# Patient Record
Sex: Female | Born: 1975 | Race: White | Hispanic: No | Marital: Married | Smoking: Never smoker
Health system: Southern US, Community
[De-identification: ages and names within clinical notes are randomized; demographics above are authoritative.]

## PROBLEM LIST (undated history)

## (undated) DIAGNOSIS — F419 Anxiety disorder, unspecified: Secondary | ICD-10-CM

## (undated) DIAGNOSIS — G4730 Sleep apnea, unspecified: Secondary | ICD-10-CM

## (undated) DIAGNOSIS — Z9889 Other specified postprocedural states: Secondary | ICD-10-CM

## (undated) DIAGNOSIS — K5792 Diverticulitis of intestine, part unspecified, without perforation or abscess without bleeding: Secondary | ICD-10-CM

## (undated) DIAGNOSIS — J45909 Unspecified asthma, uncomplicated: Secondary | ICD-10-CM

## (undated) DIAGNOSIS — R112 Nausea with vomiting, unspecified: Secondary | ICD-10-CM

## (undated) DIAGNOSIS — F32A Depression, unspecified: Secondary | ICD-10-CM

## (undated) DIAGNOSIS — G43909 Migraine, unspecified, not intractable, without status migrainosus: Secondary | ICD-10-CM

## (undated) HISTORY — DX: Sleep apnea, unspecified: G47.30

## (undated) HISTORY — DX: Migraine, unspecified, not intractable, without status migrainosus: G43.909

## (undated) HISTORY — PX: NASAL SINUS SURGERY: SHX719

## (undated) HISTORY — DX: Unspecified asthma, uncomplicated: J45.909

## (undated) HISTORY — PX: ANKLE SURGERY: SHX546

## (undated) HISTORY — DX: Diverticulitis of intestine, part unspecified, without perforation or abscess without bleeding: K57.92

## (undated) HISTORY — DX: Anxiety disorder, unspecified: F41.9

## (undated) HISTORY — DX: Depression, unspecified: F32.A

## (undated) HISTORY — PX: KNEE SURGERY: SHX244

## (undated) HISTORY — PX: OTHER SURGICAL HISTORY: SHX169

---

## 2018-11-18 HISTORY — PX: COLONOSCOPY: SHX174

## 2020-06-17 ENCOUNTER — Encounter: Payer: Self-pay | Admitting: Internal Medicine

## 2020-07-25 ENCOUNTER — Ambulatory Visit: Payer: Self-pay | Admitting: Gastroenterology

## 2020-07-25 ENCOUNTER — Other Ambulatory Visit: Payer: Self-pay

## 2020-07-25 ENCOUNTER — Encounter: Payer: Self-pay | Admitting: Gastroenterology

## 2020-07-25 ENCOUNTER — Ambulatory Visit (INDEPENDENT_AMBULATORY_CARE_PROVIDER_SITE_OTHER): Payer: PRIVATE HEALTH INSURANCE | Admitting: Gastroenterology

## 2020-07-25 DIAGNOSIS — K5732 Diverticulitis of large intestine without perforation or abscess without bleeding: Secondary | ICD-10-CM

## 2020-07-25 DIAGNOSIS — R198 Other specified symptoms and signs involving the digestive system and abdomen: Secondary | ICD-10-CM | POA: Diagnosis not present

## 2020-07-25 DIAGNOSIS — R110 Nausea: Secondary | ICD-10-CM

## 2020-07-25 DIAGNOSIS — R109 Unspecified abdominal pain: Secondary | ICD-10-CM | POA: Insufficient documentation

## 2020-07-25 NOTE — Progress Notes (Signed)
Primary Care Physician:  Marylee Floras, FNP  Primary Gastroenterologist:  Garfield Cornea, MD    Chief Complaint  Patient presents with  . diverticulits    Still having llq abd pain since 05/2020 episode-went to ED (was treated with Cipro and Flagyl). Has had several flares since last fall. 1st episode 09/2018  . Nausea  . Constipation    With occ days of several stools    HPI:  Tiffany Snow is a 45 y.o. female here at the request of Eulis Manly, FNP for further evaluation of diverticulitis.  Patient presents today reporting several episodes of diverticulitis, last episode from February but does not seem to be getting better.  Initial diagnosis in June 2020 when she presented to [facility] ER with left-sided abdominal pain.  Patient reports having a CT scan for documented diverticulitis.  Was treated with Cipro and Flagyl.  Saw Dr. Earley Brooke in follow-up.  Ports completing a colonoscopy, had at least 2 polyps, advised to come back in 5 years.  Patient states she did okay until October 2021, with recurrent left-sided pain.  Was seen at [facility] ED.  Reports another CT scan showing diverticulitis.  Prescribed Cipro and Flagyl, took only Flagyl because Cipro was on backorder.  Back in the ED the following month (November 2021) at [facility] ED.  Reports another CT scan again showing diverticulitis.  Took course of Cipro and Flagyl which seemed to help.  Had a few little flares after that but in February 2022 ended back up in the ED at [facility] with recurrent left-sided abdominal pain felt to be diverticulitis.  No CT scan done at that time.  She took Cipro and Flagyl and within 2 weeks after stopping medication, her pain returned.  She had a second course given by PCP but pain has not improved.    Continues to have daily left-sided abdominal pain associated with nausea.  Persisting now for 8 weeks.  Nausea at least 3-4 times per week.  No vomiting.  Complains of change in  bowel habit.  Used to have daily morning regular BM for the past 2 months stools feel incomplete/inadequate.  Some days BMs okay, some days no stools.  Other days Bristol 1 or Bristol 5.  No melena or rectal bleeding.  Abdominal pain worse with bending over, twisting.  If she tries to eat anything other than bland soft foods, can make her pain worse.  Denies dysuria or hematuria.  No unintentional weight loss.    Current Outpatient Medications  Medication Sig Dispense Refill  . busPIRone (BUSPAR) 15 MG tablet Take 15 mg by mouth daily.    . cetirizine (ZYRTEC) 10 MG tablet Take 10 mg by mouth daily.    . citalopram (CELEXA) 40 MG tablet Take 40 mg by mouth daily.    . fluticasone (FLONASE) 50 MCG/ACT nasal spray Place into both nostrils as needed for allergies or rhinitis.    Marland Kitchen montelukast (SINGULAIR) 10 MG tablet Take 10 mg by mouth at bedtime.    . Multiple Vitamin (MULTIVITAMIN) tablet Take 1 tablet by mouth daily.    Marland Kitchen topiramate (TOPAMAX) 25 MG tablet Take 25 mg by mouth 2 (two) times daily.     No current facility-administered medications for this visit.    Allergies as of 07/25/2020 - Review Complete 07/25/2020  Allergen Reaction Noted  . Avelox [moxifloxacin] Hives 07/25/2020  . Other  07/25/2020  . Penicillins Hives 07/25/2020    Past Medical History:  Diagnosis  Date  . Anxiety   . Asthma   . Depression   . Migraine   . Sleep apnea     Past Surgical History:  Procedure Laterality Date  . ANKLE SURGERY Left   . carpel tunnel Bilateral   . KNEE SURGERY     four total  . NASAL SINUS SURGERY      Family History  Problem Relation Age of Onset  . Hypertension Mother   . Diverticulitis Mother   . COPD Father   . Colon cancer Neg Hx     Social History   Socioeconomic History  . Marital status: Married    Spouse name: Not on file  . Number of children: Not on file  . Years of education: Not on file  . Highest education level: Not on file  Occupational History   . Not on file  Tobacco Use  . Smoking status: Never Smoker  . Smokeless tobacco: Never Used  Substance and Sexual Activity  . Alcohol use: Not Currently    Comment: no h/o significant etoh use. none in sever years.  . Drug use: Not Currently  . Sexual activity: Not on file  Other Topics Concern  . Not on file  Social History Narrative  . Not on file   Social Determinants of Health   Financial Resource Strain: Not on file  Food Insecurity: Not on file  Transportation Needs: Not on file  Physical Activity: Not on file  Stress: Not on file  Social Connections: Not on file  Intimate Partner Violence: Not on file      ROS:  General: Negative for anorexia, weight loss, fever, chills, fatigue, weakness. Eyes: Negative for vision changes.  ENT: Negative for hoarseness, difficulty swallowing , nasal congestion. CV: Negative for chest pain, angina, palpitations, dyspnea on exertion, peripheral edema.  Respiratory: Negative for dyspnea at rest, dyspnea on exertion, cough, sputum, wheezing.  GI: See history of present illness. GU:  Negative for dysuria, hematuria, urinary incontinence, urinary frequency, nocturnal urination.  MS: Negative for joint pain, low back pain.  Derm: Negative for rash or itching.  Neuro: Negative for weakness, abnormal sensation, seizure, frequent headaches, memory loss, confusion.  Psych: Negative for anxiety, depression, suicidal ideation, hallucinations.  Endo: Negative for unusual weight change.  Heme: Negative for bruising or bleeding. Allergy: Negative for rash or hives.    Physical Examination:  BP 133/75   Pulse 78   Temp (!) 97.5 F (36.4 C) (Temporal)   Ht 4\' 11"  (1.499 m)   Wt 260 lb 12.8 oz (118.3 kg)   BMI 52.68 kg/m    General: Well-nourished, well-developed in no acute distress.  Head: Normocephalic, atraumatic.   Eyes: Conjunctiva pink, no icterus. Mouth: masked Neck: Supple without thyromegaly, masses, or lymphadenopathy.   Lungs: Clear to auscultation bilaterally.  Heart: Regular rate and rhythm, no murmurs rubs or gallops.  Abdomen: Bowel sounds are normal, nondistended, no hepatosplenomegaly or masses, no abdominal bruits or hernia, no rebound or guarding.  Exam limited by body habitus.  Tenderness noted throughout the entire left abdomen from left lower quadrant to left upper quadrant. Rectal: not performed Extremities: No lower extremity edema. No clubbing or deformities.  Neuro: Alert and oriented x 4 , grossly normal neurologically.  Skin: Warm and dry, no rash or jaundice.   Psych: Alert and cooperative, normal mood and affect.   Imaging Studies: No results found.  Impression/plan:  Pleasant 45 year old female with morbid obesity, migraines, sleep apnea, anxiety/depression presenting for further  evaluation of persistent left-sided abdominal pain.  Patient describes several ED evaluations since June 2020, multiple CTs, indicating diverticulitis.  All were done at outside facilities, records not available at this time but have been requested.  Most recently seen in the ED in February, treated empirically for diverticulitis at that time.  Took 2 rounds of Cipro and Flagyl and notes no improvement in symptoms.  She has had some change in bowel habits.  Less productive stools.  Stool consistency ranging from hard to loose.  Initially will obtain records of prior CTs, colonoscopy/path for review.  May require additional CT imaging to rule out complicated diverticulitis given persistent symptoms.  Further recommendations to follow review of studies.  Add fiber supplement 3 to 4 g daily to try and regulate stools.  Call with any worsening symptoms.  Marland Kitchen

## 2020-07-25 NOTE — Patient Instructions (Signed)
1. We will request records for review.  Further recommendations regarding repeat CT to follow. 2. Add fiber supplement daily.  Strive for 3 to 4 g of supplemental fiber daily.  Benefiber powder/chewable or Fiberchoice chewables are good options.  Avoid fiber tablets or capsules.

## 2020-08-11 ENCOUNTER — Telehealth: Payer: Self-pay | Admitting: Internal Medicine

## 2020-08-11 DIAGNOSIS — R109 Unspecified abdominal pain: Secondary | ICD-10-CM

## 2020-08-11 NOTE — Telephone Encounter (Signed)
Pt was seen recently by Neil Crouch, PA on 07/25/2020 and following up on her medical records that we were requesting. I told her that I would have to check with Magda Paganini because I don't remember what all has come in via fax. I have re-faxed request again today and on 08/06/2020.

## 2020-08-11 NOTE — Telephone Encounter (Signed)
Tiffany Snow, please let pt know that we have not received all of her records. Last week I received ED records from [place] from 09/2018.   I don't have the [place] (CT A/P 01/2020, 02/2020).  I don't have prior colonoscopy and path reports (I don't remember where this was done).

## 2020-08-11 NOTE — Telephone Encounter (Signed)
Received from [facility], dated June 2020: White blood cell count 17,680, hemoglobin 14.4, creatinine 0.79, total bilirubin 1.2, alkaline phosphatase 130, AST 46, ALT 91, lipase 134  Abdominal ultrasound October 10, 2018: Hepatic steatosis, nonobstructing right renal calculus.  Common bile duct 2 mm  CT abdomen pelvis with contrast dated October 10, 2018: Diverticulitis involving the descending colon.  No associated abscess nor perforation.  Hepatic steatosis.  Diffuse diverticulosis.  SUSAN CAN WE TRY AND GET HER CTS AND LABS FROM [facility] ED VISITS 10/21 AND 11/201 AND HER LAST COLONOSCOPY AND PATH?

## 2020-08-12 NOTE — Telephone Encounter (Signed)
I have faxed the release of records again today for a 3rd time and marked as STAT

## 2020-08-15 NOTE — Telephone Encounter (Signed)
Pt called to see if records have been received. Manuela Schwartz advised she had received records and gave to [place]. Pt getting anxious to hear about next step because she is still hurting.

## 2020-08-15 NOTE — Telephone Encounter (Signed)
Yesterday we received colonoscopy report from 2020. We were never able to get CTs from [place].   At this time I would I would recommend pursuing updated CT A/P with contrast for left sided abd pain, suspected diverticulitis. Can we please get it done by first of the week.

## 2020-08-15 NOTE — Telephone Encounter (Signed)
Called and informed pt of Leslie's advice. Informed her our office closes at 12:00pm today. I will check with insurance company Monday to see if CT needs PA prior to being able to schedule CT.

## 2020-08-18 ENCOUNTER — Other Ambulatory Visit: Payer: Self-pay

## 2020-08-18 ENCOUNTER — Ambulatory Visit (HOSPITAL_COMMUNITY)
Admission: RE | Admit: 2020-08-18 | Discharge: 2020-08-18 | Disposition: A | Discharge status: Home / Self Care | Payer: PRIVATE HEALTH INSURANCE | Source: Ambulatory Visit | Attending: Gastroenterology | Admitting: Gastroenterology

## 2020-08-18 DIAGNOSIS — R109 Unspecified abdominal pain: Secondary | ICD-10-CM | POA: Insufficient documentation

## 2020-08-18 MED ORDER — IOHEXOL 9 MG/ML PO SOLN
ORAL | Status: AC
  Filled 2020-08-18: qty 1000

## 2020-08-18 MED ORDER — IOHEXOL 300 MG/ML  SOLN
100.0000 mL | Freq: Once | INTRAMUSCULAR | Status: AC | PRN
  Administered 2020-08-18: 100 mL via INTRAVENOUS

## 2020-08-18 NOTE — Addendum Note (Signed)
Addended by: Hassan Rowan on: 08/18/2020 07:52 AM   Modules accepted: Orders

## 2020-08-18 NOTE — Telephone Encounter (Signed)
[place] to see if PA is needed for CT abd/pelvis w/contrast, LMOVM for [name].

## 2020-08-18 NOTE — Telephone Encounter (Signed)
[name] at [facility] called office, no PA needed for CT.  Called pt, she was drinking coffee. Can go now for CT if needed. She's in [place].  [employer], pt can go now for CT.  Called pt back, informed her she can go now for CT. Advised her to be NPO. Will start drinking contrast on arrival.

## 2020-08-20 ENCOUNTER — Other Ambulatory Visit: Payer: Self-pay | Admitting: Gastroenterology

## 2020-08-20 ENCOUNTER — Other Ambulatory Visit: Payer: Self-pay | Admitting: *Deleted

## 2020-08-20 DIAGNOSIS — K429 Umbilical hernia without obstruction or gangrene: Secondary | ICD-10-CM

## 2020-08-20 MED ORDER — LINACLOTIDE 145 MCG PO CAPS: 145.0000 ug | ORAL_CAPSULE | Freq: Every day | ORAL | 3 refills | Status: DC

## 2020-08-22 ENCOUNTER — Telehealth: Payer: Self-pay

## 2020-08-22 NOTE — Telephone Encounter (Signed)
Returned the pt's call from my telephone message. Pt was advising me that the Linzess she started 2 days ago was giving her explosive diarrhea. Her question was how long would this be. I told the pt this could last for a couple of weeks. I advised the pt to continue it over the weekend to make sure she is cleaned out since she has weekends off. She agreed. I advised her that if she doesn't need it Monday do not take it that she can go to using it every other day if need be or when she really feels like she needs it. Pt agreed not to take everyday but start with every other day.

## 2020-09-16 ENCOUNTER — Telehealth: Payer: Self-pay

## 2020-09-16 ENCOUNTER — Encounter (HOSPITAL_COMMUNITY): Payer: Self-pay | Admitting: *Deleted

## 2020-09-16 ENCOUNTER — Other Ambulatory Visit: Payer: Self-pay

## 2020-09-16 ENCOUNTER — Encounter: Payer: Self-pay | Admitting: General Surgery

## 2020-09-16 ENCOUNTER — Emergency Department (HOSPITAL_COMMUNITY)
Admission: EM | Admit: 2020-09-16 | Discharge: 2020-09-17 | Disposition: A | Discharge status: Home / Self Care | Payer: PRIVATE HEALTH INSURANCE | Attending: Emergency Medicine | Admitting: Emergency Medicine

## 2020-09-16 ENCOUNTER — Emergency Department (HOSPITAL_COMMUNITY): Payer: PRIVATE HEALTH INSURANCE

## 2020-09-16 ENCOUNTER — Ambulatory Visit (INDEPENDENT_AMBULATORY_CARE_PROVIDER_SITE_OTHER): Payer: PRIVATE HEALTH INSURANCE | Admitting: General Surgery

## 2020-09-16 VITALS — BP 123/81 | HR 94 | Temp 98.4°F | Resp 14 | Ht 59.0 in | Wt 259.0 lb

## 2020-09-16 DIAGNOSIS — K5732 Diverticulitis of large intestine without perforation or abscess without bleeding: Secondary | ICD-10-CM

## 2020-09-16 DIAGNOSIS — R109 Unspecified abdominal pain: Secondary | ICD-10-CM | POA: Diagnosis not present

## 2020-09-16 DIAGNOSIS — Z7951 Long term (current) use of inhaled steroids: Secondary | ICD-10-CM | POA: Insufficient documentation

## 2020-09-16 DIAGNOSIS — K5900 Constipation, unspecified: Secondary | ICD-10-CM | POA: Diagnosis not present

## 2020-09-16 DIAGNOSIS — J45909 Unspecified asthma, uncomplicated: Secondary | ICD-10-CM | POA: Insufficient documentation

## 2020-09-16 DIAGNOSIS — K429 Umbilical hernia without obstruction or gangrene: Secondary | ICD-10-CM | POA: Diagnosis not present

## 2020-09-16 DIAGNOSIS — K5792 Diverticulitis of intestine, part unspecified, without perforation or abscess without bleeding: Secondary | ICD-10-CM

## 2020-09-16 DIAGNOSIS — R1032 Left lower quadrant pain: Secondary | ICD-10-CM

## 2020-09-16 LAB — URINALYSIS, ROUTINE W REFLEX MICROSCOPIC
Bilirubin Urine: NEGATIVE
Glucose, UA: NEGATIVE mg/dL
Ketones, ur: NEGATIVE mg/dL
Leukocytes,Ua: NEGATIVE
Nitrite: NEGATIVE
Protein, ur: NEGATIVE mg/dL
Specific Gravity, Urine: 1.02 (ref 1.005–1.030)
pH: 5 (ref 5.0–8.0)

## 2020-09-16 LAB — CBC WITH DIFFERENTIAL/PLATELET
Abs Immature Granulocytes: 0.06 10*3/uL (ref 0.00–0.07)
Basophils Absolute: 0 10*3/uL (ref 0.0–0.1)
Basophils Relative: 0 %
Eosinophils Absolute: 0.2 10*3/uL (ref 0.0–0.5)
Eosinophils Relative: 1 %
HCT: 41.9 % (ref 36.0–46.0)
Hemoglobin: 13.8 g/dL (ref 12.0–15.0)
Immature Granulocytes: 0 %
Lymphocytes Relative: 13 %
Lymphs Abs: 1.9 10*3/uL (ref 0.7–4.0)
MCH: 32.6 pg (ref 26.0–34.0)
MCHC: 32.9 g/dL (ref 30.0–36.0)
MCV: 99.1 fL (ref 80.0–100.0)
Monocytes Absolute: 0.9 10*3/uL (ref 0.1–1.0)
Monocytes Relative: 6 %
Neutro Abs: 12 10*3/uL — ABNORMAL HIGH (ref 1.7–7.7)
Neutrophils Relative %: 80 %
Platelets: 378 10*3/uL (ref 150–400)
RBC: 4.23 MIL/uL (ref 3.87–5.11)
RDW: 13.2 % (ref 11.5–15.5)
WBC: 15 10*3/uL — ABNORMAL HIGH (ref 4.0–10.5)
nRBC: 0 % (ref 0.0–0.2)

## 2020-09-16 LAB — COMPREHENSIVE METABOLIC PANEL
ALT: 51 U/L — ABNORMAL HIGH (ref 0–44)
AST: 29 U/L (ref 15–41)
Albumin: 4.2 g/dL (ref 3.5–5.0)
Alkaline Phosphatase: 91 U/L (ref 38–126)
Anion gap: 10 (ref 5–15)
BUN: 6 mg/dL (ref 6–20)
CO2: 23 mmol/L (ref 22–32)
Calcium: 9.2 mg/dL (ref 8.9–10.3)
Chloride: 103 mmol/L (ref 98–111)
Creatinine, Ser: 0.75 mg/dL (ref 0.44–1.00)
GFR, Estimated: >60 mL/min (ref >60)
Glucose, Bld: 116 mg/dL — ABNORMAL HIGH (ref 70–99)
Potassium: 3.3 mmol/L — ABNORMAL LOW (ref 3.5–5.1)
Sodium: 136 mmol/L (ref 135–145)
Total Bilirubin: 0.8 mg/dL (ref 0.3–1.2)
Total Protein: 8.2 g/dL — ABNORMAL HIGH (ref 6.5–8.1)

## 2020-09-16 LAB — LIPASE, BLOOD: Lipase: 38 U/L (ref 11–51)

## 2020-09-16 LAB — PREGNANCY, URINE: Preg Test, Ur: NEGATIVE

## 2020-09-16 MED ORDER — SODIUM CHLORIDE 0.9 % IV BOLUS
1000.0000 mL | Freq: Once | INTRAVENOUS | Status: AC
  Administered 2020-09-16: 1000 mL via INTRAVENOUS

## 2020-09-16 MED ORDER — ONDANSETRON HCL 4 MG/2ML IJ SOLN
4.0000 mg | Freq: Once | INTRAMUSCULAR | Status: AC
  Administered 2020-09-16: 4 mg via INTRAVENOUS
  Filled 2020-09-16: qty 2

## 2020-09-16 MED ORDER — CIPROFLOXACIN HCL 500 MG PO TABS: 500.0000 mg | ORAL_TABLET | Freq: Two times a day (BID) | ORAL | 0 refills | Status: DC

## 2020-09-16 MED ORDER — MORPHINE SULFATE (PF) 4 MG/ML IV SOLN
4.0000 mg | Freq: Once | INTRAVENOUS | Status: AC
  Administered 2020-09-16: 4 mg via INTRAVENOUS
  Filled 2020-09-16: qty 1

## 2020-09-16 MED ORDER — METRONIDAZOLE 500 MG/100ML IV SOLN
500.0000 mg | Freq: Once | INTRAVENOUS | Status: AC
  Administered 2020-09-16: 500 mg via INTRAVENOUS
  Filled 2020-09-16: qty 100

## 2020-09-16 MED ORDER — METRONIDAZOLE 500 MG PO TABS: 500.0000 mg | ORAL_TABLET | Freq: Two times a day (BID) | ORAL | 0 refills | Status: DC

## 2020-09-16 MED ORDER — HYDROCODONE-ACETAMINOPHEN 5-325 MG PO TABS: 1.0000 | ORAL_TABLET | Freq: Four times a day (QID) | ORAL | 0 refills | Status: DC | PRN

## 2020-09-16 MED ORDER — CIPROFLOXACIN IN D5W 400 MG/200ML IV SOLN
400.0000 mg | Freq: Once | INTRAVENOUS | Status: AC
  Administered 2020-09-17: 400 mg via INTRAVENOUS
  Filled 2020-09-16: qty 200

## 2020-09-16 NOTE — Telephone Encounter (Signed)
Tiffany Snow went ahead and took care of this, the pt is on her way to the ER but I will make contact with her tomorrow

## 2020-09-16 NOTE — Patient Instructions (Addendum)
Will send Tiffany Snow a message regarding sigmoidoscopy to ensure no sigmoid colon narrowing from the diverticulitis prior to any plans for hernia repair.   Umbilical Hernia, Adult  A hernia is a bulge of tissue that pushes through an opening between muscles. An umbilical hernia happens in the abdomen, near the belly button (umbilicus). The hernia may contain tissues from the small intestine, large intestine, or fatty tissue covering the intestines (omentum). Umbilical hernias in adults tend to get worse over time, and they require surgical treatment. There are several types of umbilical hernias. You may have:  A hernia located just above or below the umbilicus (indirect hernia). This is the most common type of umbilical hernia in adults.  A hernia that forms through an opening formed by the umbilicus (direct hernia).  A hernia that comes and goes (reducible hernia). A reducible hernia may be visible only when you strain, lift something heavy, or cough. This type of hernia can be pushed back into the abdomen (reduced).  A hernia that traps abdominal tissue inside the hernia (incarcerated hernia). This type of hernia cannot be reduced.  A hernia that cuts off blood flow to the tissues inside the hernia (strangulated hernia). The tissues can start to die if this happens. This type of hernia requires emergency treatment. What are the causes? An umbilical hernia happens when tissue inside the abdomen presses on a weak area of the abdominal muscles. What increases the risk? You may have a greater risk of this condition if you:  Are obese.  Have had several pregnancies.  Have a buildup of fluid inside your abdomen (ascites).  Have had surgery that weakens the abdominal muscles. What are the signs or symptoms? The main symptom of this condition is a painless bulge at or near the belly button. A reducible hernia may be visible only when you strain, lift something heavy, or cough. Other symptoms  may include:  Dull pain.  A feeling of pressure. Symptoms of a strangulated hernia may include:  Pain that gets increasingly worse.  Nausea and vomiting.  Pain when pressing on the hernia.  Skin over the hernia becoming red or purple.  Constipation.  Blood in the stool. How is this diagnosed? This condition may be diagnosed based on:  A physical exam. You may be asked to cough or strain while standing. These actions increase the pressure inside your abdomen and force the hernia through the opening in your muscles. Your health care provider may try to reduce the hernia by pressing on it.  Your symptoms and medical history. How is this treated? Surgery is the only treatment for an umbilical hernia. Surgery for a strangulated hernia is done as soon as possible. If you have a small hernia that is not incarcerated, you may need to lose weight before having surgery. Follow these instructions at home:  Lose weight, if told by your health care provider.  Do not try to push the hernia back in.  Watch your hernia for any changes in color or size. Tell your health care provider if any changes occur.  You may need to avoid activities that increase pressure on your hernia.  Do not lift anything that is heavier than 10 lb (4.5 kg) until your health care provider says that this is safe.  Take over-the-counter and prescription medicines only as told by your health care provider.  Keep all follow-up visits as told by your health care provider. This is important. Contact a health care provider if:  Your hernia gets larger.  Your hernia becomes painful. Get help right away if:  You develop sudden, severe pain near the area of your hernia.  You have pain as well as nausea or vomiting.  You have pain and the skin over your hernia changes color.  You develop a fever. This information is not intended to replace advice given to you by your health care provider. Make sure you discuss  any questions you have with your health care provider. Document Revised: 05/18/2017 Document Reviewed: 10/04/2016 Elsevier Patient Education  [place].

## 2020-09-16 NOTE — Discharge Instructions (Signed)
You are seen in the emergency department for worsening left lower quadrant abdominal pain.  You had blood work urinalysis and a CAT scan of your abdomen and pelvis.  Please finish your antibiotics.  We are also prescribing you with a prescription for some pain medication.  Please be advised that this may make you nauseous dizzy and constipated.  Follow-up with your GI doctor.  Return to the emergency department if any worsening or concerning symptoms

## 2020-09-16 NOTE — Telephone Encounter (Signed)
The pt stated her pain is worse from 08/18/2020 and she states she will head up here to the ER to e evaluated. Also advised of the Tylenol use and further recommendations per [facility] tomorrow

## 2020-09-16 NOTE — Telephone Encounter (Signed)
error 

## 2020-09-16 NOTE — Telephone Encounter (Signed)
Unfortunately, I am unable to review Dr. Constance Haw note from today.   Has her pain worsened compared to the pain she was having at the time of her CT scan on 5/2? If she is having severe pain, she will need to be evaluated in the emergency room. She may have recurrent diverticulitis which will need to be evaluated by repeat CT.   Regarding management of pain, recommend using Tylenol 500 mg q4-6 hours as needed. No more than 3000 mg of tylenol per 24 hours.   Further recommendations per Magda Paganini when she returns tomorrow.

## 2020-09-16 NOTE — ED Provider Notes (Signed)
Emergency Medicine Provider Triage Evaluation Note  Tiffany Snow , a 45 y.o. female  was evaluated in triage.  Pt complains of left lower quadrant abdominal pain x1 day.  Patient has a history of diverticulitis and states it feels similar to past episodes.  Denies vomiting and diarrhea.  Admits to nausea.  Patient was evaluated by Dr. Constance Haw earlier today due to a hernia which patient notes she did not believe was causing her pain.  No fever or chills.  Patient denies urinary vaginal symptoms.  Review of Systems  Positive: Abdominal pain Negative: fever  Physical Exam  BP (!) 145/86 (BP Location: Right Wrist)   Pulse 96   Temp 98.9 F (37.2 C) (Oral)   Resp 17   Ht 4\' 11"  (1.499 m)   Wt 117.5 kg   LMP  (LMP Unknown)   SpO2 100%   BMI 52.31 kg/m  Gen:   Awake, no distress   Resp:  Normal effort  MSK:   Moves extremities without difficulty  Other:  TTP in LLQ  Medical Decision Making  Medically screening exam initiated at 8:14 PM.  Appropriate orders placed.  Tiffany Snow was informed that the remainder of the evaluation will be completed by another provider, this initial triage assessment does not replace that evaluation, and the importance of remaining in the ED until their evaluation is complete.  Abdominal labs ordered.    Tiffany Snow 09/16/20 2016    Tiffany Rasmussen, MD 09/17/20 [phone]

## 2020-09-16 NOTE — Telephone Encounter (Signed)
I have not seen her in over a month. I would suggest she ask surgeon for something for pain since they evaluated her today. Dr. Constance Haw OV note not yet available so I'm not clear on her recommendations at this time.  Is she moving her bowels better with Linzess?

## 2020-09-16 NOTE — Progress Notes (Signed)
[facility] History and Physical  Reason for Referral: Abdominal pain, umbilical hernia  Referring Physician:  Neil Crouch, PA [facility]  Chief Complaint    New Patient (Initial Visit); Hernia      ZINIA INNOCENT is a 45 y.o. female.  HPI:  Ms. Hennigan is a 45 yo with a history of multiple flares of diverticulitis that started in 2020 and recent LLQ pain that has been chronic in nature and sharp. She had been seen by GI and was worried she was having another flare but had a CT that did not demonstrate any active inflammation. She had a small fat containing umbilical hernia. She had a colonoscopy in [place] in 2020 and reports no major findings.  She says she had COVID a few weeks back and has been having some coughing and SOB related to this. She is coming in to discuss her pain and the hernia.  She says she has suffered from constipation since her diverticulitis started in 2020. She is now on Linzess and will have Bms if she takes it but says that it is a 2 hour process and that she cannot take it everyday because of the time commitment.   Past Medical History:  Diagnosis Date  . Anxiety   . Asthma   . Depression   . Migraine   . Sleep apnea     Past Surgical History:  Procedure Laterality Date  . ANKLE SURGERY Left   . carpel tunnel Bilateral   . KNEE SURGERY     four total  . NASAL SINUS SURGERY      Family History  Problem Relation Age of Onset  . Hypertension Mother   . Diverticulitis Mother   . COPD Father   . Colon cancer Neg Hx     Social History   Tobacco Use  . Smoking status: Never Smoker  . Smokeless tobacco: Never Used  Substance Use Topics  . Alcohol use: Not Currently    Comment: no h/o significant etoh use. none in sever years.  . Drug use: Not Currently    Medications: I have reviewed the patient's current medications. Allergies as of 09/16/2020      Reactions   Avelox [moxifloxacin] Hives   Other    Some bandaids cause rash    Penicillins Hives      Medication List       Accurate as of Sep 16, 2020 10:32 AM. If you have any questions, ask your nurse or doctor.        busPIRone 15 MG tablet Commonly known as: BUSPAR Take 15 mg by mouth daily.   cetirizine 10 MG tablet Commonly known as: ZYRTEC Take 10 mg by mouth daily.   citalopram 40 MG tablet Commonly known as: CELEXA Take 40 mg by mouth daily.   fluticasone 50 MCG/ACT nasal spray Commonly known as: FLONASE Place into both nostrils as needed for allergies or rhinitis.   linaclotide 145 MCG Caps capsule Commonly known as: Linzess Take 1 capsule (145 mcg total) by mouth daily before breakfast.   montelukast 10 MG tablet Commonly known as: SINGULAIR Take 10 mg by mouth at bedtime.   multivitamin tablet Take 1 tablet by mouth daily.   topiramate 25 MG tablet Commonly known as: TOPAMAX Take 25 mg by mouth 2 (two) times daily.        ROS:  A comprehensive review of systems was negative except for: Respiratory: positive for SOB, Coughing Gastrointestinal: positive for abdominal pain and nausea  Musculoskeletal: positive for joint pain Neurological: positive for headaches  Blood pressure 123/81, pulse 94, temperature 98.4 F (36.9 C), temperature source Other (Comment), resp. rate 14, height 4\' 11"  (1.499 m), weight 259 lb (117.5 kg), SpO2 95 %. Physical Exam Vitals reviewed.  Constitutional:      Appearance: She is obese.  HENT:     Head: Normocephalic.     Nose: Nose normal.  Eyes:     Extraocular Movements: Extraocular movements intact.  Cardiovascular:     Rate and Rhythm: Normal rate and regular rhythm.  Pulmonary:     Effort: Pulmonary effort is normal.     Breath sounds: Normal breath sounds.  Abdominal:     General: There is no distension.     Palpations: Abdomen is soft.     Tenderness: There is abdominal tenderness in the suprapubic area and left lower quadrant.     Hernia: A hernia is present. Hernia is present in  the umbilical area.     Comments: Tender in the left flank, LLQ, small hernia, minimally tender around this, reducible  Musculoskeletal:        General: No swelling.     Cervical back: Normal range of motion.  Skin:    General: Skin is warm.  Neurological:     General: No focal deficit present.     Mental Status: She is alert and oriented to person, place, and time.  Psychiatric:        Mood and Affect: Mood normal.        Behavior: Behavior normal.        Thought Content: Thought content normal.        Judgment: Judgment normal.     Results: CT a/p reviewed -small fat containing hernia, thickening sigmoid colon with tic, no contrast in this area to anything about lumen and no IV contrast to tell about wall thickening   Assessment & Plan:  ABRINA PETZ is a 45 y.o. female with chronic constipation, LLQ pain after multiple flares of diverticulitis. She has had a colonoscopy in the past and no active diverticulitis on CT but I am worried about narrowing/ stricture causing these issues. I do not think it is from the hernia.  The chronically thickened / inflamed colon could also cause the issue without narrowing potentially. Discussed that we do not want to do any hernia surgery pending need for colectomy. Discussed that a sigmoidoscopy would give [place] more information.   Sent Neil Crouch a message regarding sigmoidoscopy Discussed reasons to go to ED for hernia  Will see patient back after further GI evaluation.   All questions answered   Virl Cagey 09/16/2020, 10:32 AM

## 2020-09-16 NOTE — ED Triage Notes (Signed)
Pt c/o left side abdominal pain with nausea that started yesterday

## 2020-09-16 NOTE — ED Provider Notes (Signed)
[facility] EMERGENCY DEPARTMENT Provider Note   CSN: [id number] Arrival date & time: 09/16/20  1826     History Chief Complaint  Patient presents with  . Abdominal Pain    Tiffany Snow is a 45 y.o. female.  She is complaining of severe left-sided lower abdominal pain that started yesterday.  This feels typical of her diverticulitis.  She has been a little constipated.  No fevers or chills no vomiting or diarrhea.  She saw Dr. Constance Haw general surgery today for evaluation of her umbilical hernia.  It is unclear with Dr. Constance Haw thought about her left lower quadrant pain as her note is not completed.  She called her GI doctors who recommended she come to the emergency department for evaluation.  The history is provided by the patient.  Abdominal Pain Pain location:  LLQ Pain quality: aching   Pain radiates to:  Does not radiate Pain severity:  Severe Onset quality:  Gradual Duration:  2 days Timing:  Constant Progression:  Worsening Chronicity:  Recurrent Context: not recent travel, not suspicious food intake and not trauma   Relieved by:  Nothing Worsened by:  Movement and palpation Ineffective treatments:  None tried Associated symptoms: constipation   Associated symptoms: no chest pain, no cough, no diarrhea, no dysuria, no fever, no hematemesis, no hematochezia, no nausea, no shortness of breath, no sore throat and no vomiting        Past Medical History:  Diagnosis Date  . Anxiety   . Asthma   . Depression   . Migraine   . Sleep apnea     Patient Active Problem List   Diagnosis Date Noted  . Umbilical hernia without obstruction or gangrene 09/16/2020  . Diverticulitis of colon without hemorrhage 07/25/2020  . Left sided abdominal pain 07/25/2020  . Nausea without vomiting 07/25/2020  . Change in bowel function 07/25/2020    Past Surgical History:  Procedure Laterality Date  . ANKLE SURGERY Left   . carpel tunnel Bilateral   . KNEE SURGERY     four  total  . NASAL SINUS SURGERY       OB History   No obstetric history on file.     Family History  Problem Relation Age of Onset  . Hypertension Mother   . Diverticulitis Mother   . COPD Father   . Colon cancer Neg Hx     Social History   Tobacco Use  . Smoking status: Never Smoker  . Smokeless tobacco: Never Used  Substance Use Topics  . Alcohol use: Not Currently    Comment: no h/o significant etoh use. none in sever years.  . Drug use: Not Currently    Home Medications Prior to Admission medications   Medication Sig Start Date End Date Taking? Authorizing Provider  busPIRone (BUSPAR) 15 MG tablet Take 15 mg by mouth daily.    [provider]  cetirizine (ZYRTEC) 10 MG tablet Take 10 mg by mouth daily.    [provider]  citalopram (CELEXA) 40 MG tablet Take 40 mg by mouth daily.    [provider]  fluticasone (FLONASE) 50 MCG/ACT nasal spray Place into both nostrils as needed for allergies or rhinitis.    [provider]  linaclotide Rolan Lipa) 145 MCG CAPS capsule Take 1 capsule (145 mcg total) by mouth daily before breakfast. 08/20/20   Mahala Menghini, PA-C  montelukast (SINGULAIR) 10 MG tablet Take 10 mg by mouth at bedtime.    [provider]  Multiple Vitamin (MULTIVITAMIN) tablet Take 1 tablet by mouth daily.    [provider]  topiramate (TOPAMAX) 25 MG tablet Take 25 mg by mouth 2 (two) times daily.    [provider]    Allergies    Avelox [moxifloxacin], Other, and Penicillins  Review of Systems   Review of Systems  Constitutional: Negative for fever.  HENT: Negative for sore throat.   Eyes: Negative for visual disturbance.  Respiratory: Negative for cough and shortness of breath.   Cardiovascular: Negative for chest pain.  Gastrointestinal: Positive for abdominal pain and constipation. Negative for diarrhea, hematemesis, hematochezia, nausea and vomiting.  Genitourinary: Negative for  dysuria.  Musculoskeletal: Negative for neck pain.  Skin: Negative for rash.  Neurological: Negative for headaches.    Physical Exam Updated Vital Signs BP (!) 145/86 (BP Location: Right Wrist)   Pulse 96   Temp 98.9 F (37.2 C) (Oral)   Resp 17   Ht 4\' 11"  (1.499 m)   Wt 117.5 kg   LMP  (LMP Unknown)   SpO2 100%   BMI 52.31 kg/m   Physical Exam Vitals and nursing note reviewed.  Constitutional:      General: She is not in acute distress.    Appearance: Normal appearance. She is well-developed.  HENT:     Head: Normocephalic and atraumatic.  Eyes:     Conjunctiva/sclera: Conjunctivae normal.  Cardiovascular:     Rate and Rhythm: Normal rate and regular rhythm.     Heart sounds: No murmur heard.   Pulmonary:     Effort: Pulmonary effort is normal. No respiratory distress.     Breath sounds: Normal breath sounds.  Abdominal:     Palpations: Abdomen is soft.     Tenderness: There is abdominal tenderness in the left lower quadrant. There is no guarding or rebound.  Musculoskeletal:        General: No deformity or signs of injury. Normal range of motion.     Cervical back: Neck supple.  Skin:    General: Skin is warm and dry.     Capillary Refill: Capillary refill takes less than 2 seconds.  Neurological:     General: No focal deficit present.     Mental Status: She is alert.     ED Results / Procedures / Treatments   Labs (all labs ordered are listed, but only abnormal results are displayed) Labs Reviewed  CBC WITH DIFFERENTIAL/PLATELET - Abnormal; Notable for the following components:      Result Value   WBC 15.0 (*)    Neutro Abs 12.0 (*)    All other components within normal limits  COMPREHENSIVE METABOLIC PANEL - Abnormal; Notable for the following components:   Potassium 3.3 (*)    Glucose, Bld 116 (*)    Total Protein 8.2 (*)    ALT 51 (*)    All other components within normal limits  URINALYSIS, ROUTINE W REFLEX MICROSCOPIC - Abnormal; Notable for  the following components:   APPearance HAZY (*)    Hgb urine dipstick SMALL (*)    Bacteria, UA RARE (*)    All other components within normal limits  LIPASE, BLOOD  PREGNANCY, URINE    EKG None  Radiology CT Abdomen Pelvis Wo Contrast  Result Date: 09/17/2020 CLINICAL DATA:  Left-sided pain with nausea EXAM: CT ABDOMEN AND PELVIS WITHOUT CONTRAST TECHNIQUE: Multidetector CT imaging of the abdomen and pelvis was performed following the standard protocol without IV contrast. COMPARISON:  CT 08/18/2020 FINDINGS:  Lower chest: Lung bases demonstrate no acute consolidation or effusion. Normal cardiac size Hepatobiliary: Hepatic steatosis. No calcified gallstone or biliary dilatation Pancreas: Unremarkable. No pancreatic ductal dilatation or surrounding inflammatory changes. Spleen: Normal in size without focal abnormality. Adrenals/Urinary Tract: Adrenal glands are unremarkable. Kidneys are normal, without renal calculi, focal lesion, or hydronephrosis. Bladder is unremarkable. Stomach/Bowel: The stomach is nonenlarged. No dilated small bowel. Diverticular disease of the colon. Interval wall thickening and focal inflammation at the descending colon consistent with acute diverticulitis. Negative appendix. Vascular/Lymphatic: Nonaneurysmal aorta.  No suspicious nodes Reproductive: Uterus and bilateral adnexa are unremarkable. Other: Negative for free air or free fluid. Musculoskeletal: No acute or significant osseous findings. IMPRESSION: 1. Findings consistent with acute non perforated diverticulitis involving the distal descending colon. 2. Hepatic steatosis Electronically Signed   By: Donavan Foil M.D.   On: 09/17/2020 00:07    Procedures Procedures   Medications Ordered in ED Medications  morphine 4 MG/ML injection 4 mg (4 mg Intravenous Given 09/16/20 2217)  sodium chloride 0.9 % bolus 1,000 mL (0 mLs Intravenous Stopped 09/17/20 0121)  ondansetron (ZOFRAN) injection 4 mg (4 mg Intravenous Given  09/16/20 2217)  ciprofloxacin (CIPRO) IVPB 400 mg (0 mg Intravenous Stopped 09/17/20 0132)    And  metroNIDAZOLE (FLAGYL) IVPB 500 mg (0 mg Intravenous Stopped 09/17/20 0025)  fentaNYL (SUBLIMAZE) injection 50 mcg (50 mcg Intravenous Given 09/17/20 0116)  oxyCODONE-acetaminophen (PERCOCET/ROXICET) 5-325 MG per tablet 2 tablet (2 tablets Oral Given 09/17/20 0116)    ED Course  I have reviewed the triage vital signs and the nursing notes.  Pertinent labs & imaging results that were available during my care of the patient were reviewed by me and considered in my medical decision making (see chart for details).  Clinical Course as of 09/17/20 1245  Tue Sep 16, 2020  2254 The patient, she said the pain medicine is helping.  She does not want anymore because she is prepping for the CT. [MB]    Clinical Course User Index [MB] [name], MD   MDM Rules/Calculators/A&P                         This patient complains of left lower quadrant abdominal pain; this involves an extensive number of treatment Options and is a complaint that carries with it a high risk of complications and Morbidity. The differential includes diverticulitis, colitis, musculoskeletal, renal colic, obstruction  I ordered, reviewed and interpreted labs, which included CBC with elevated white count normal hemoglobin, chemistries fairly normal other than mildly low potassium elevated glucose, urinalysis without gross signs of infection, pregnancy test negative I ordered medication IV fluids, IV pain medication, oral antibiotics I ordered imaging studies which included CT abdomen and pelvis and I independently    visualized and interpreted imaging which showed acute diverticulitis Additional history obtained from patient's husband Previous records obtained and reviewed in epic, recent visits and referral to surgery for umbilical hernia  After the interventions stated above, I reevaluated the patient and found patient still  have left lower quadrant abdominal pain although improved.  Her care is signed out to oncoming provider Dr. Dayna Barker to follow-up on results of CT.  Likely if nonperforated diverticulitis patient to be discharged with outpatient follow-up with her PCP on oral antibiotics and pain medications.  These prescriptions have already sent to her pharmacy.   Final Clinical Impression(s) / ED Diagnoses Final diagnoses:  Left lower quadrant abdominal pain  Diverticulitis  Rx / DC Orders ED Discharge Orders         Ordered    ciprofloxacin (CIPRO) 500 MG tablet  Every 12 hours        09/16/20 2303    metroNIDAZOLE (FLAGYL) 500 MG tablet  2 times daily        09/16/20 2303    HYDROcodone-acetaminophen (NORCO/VICODIN) 5-325 MG tablet  Every 6 hours PRN        09/16/20 2303           [name], MD 09/17/20 [phone]

## 2020-09-16 NOTE — Telephone Encounter (Signed)
Returned the pt's call and was advised that she has been in pain since yesterday evening. The night before her pain woke her up during the night a few times and states when she goes to straighten up it hurts really bad. (pain level is a 10 and when she is moving or trying to straighten up its a 10+. ) some nausea. She seen the surgeon today and when she pressed on the lleft side of her abdomen she stated she wanted to come off the table. She states her hernia is on the right side. She wants something for pain or whatever you recommend because she is hurting just that bad. She didn't specify when her next appt is with Dr. Constance Haw.

## 2020-09-16 NOTE — Telephone Encounter (Signed)
Noted  

## 2020-09-16 NOTE — Telephone Encounter (Signed)
I sent this message to Magda Paganini earlier because the pt asked specifically for her but pt is calling back wanting some help. Here's the posted note from earlier:  Returned the pt's call and was advised that she has been in pain since yesterday evening. The night before her pain woke her up during the night a few times and states when she goes to straighten up it hurts really bad. (pain level is a 10 and when she is moving or trying to straighten up its a 10+. ) some nausea. She seen the surgeon today and when she pressed on the lleft side of her abdomen she stated she wanted to come off the table. She states her hernia is on the right side. She wants something for pain or whatever you recommend because she is hurting just that bad. She didn't specify when her next appt is with Dr. Constance Haw.

## 2020-09-17 MED ORDER — OXYCODONE-ACETAMINOPHEN 5-325 MG PO TABS
2.0000 | ORAL_TABLET | Freq: Once | ORAL | Status: AC
Start: 2020-09-17 — End: 2020-09-17
  Administered 2020-09-17: 2 via ORAL
  Filled 2020-09-17: qty 2

## 2020-09-17 MED ORDER — FENTANYL CITRATE (PF) 100 MCG/2ML IJ SOLN
50.0000 ug | Freq: Once | INTRAMUSCULAR | Status: AC
  Administered 2020-09-17: 50 ug via INTRAVENOUS
  Filled 2020-09-17: qty 2

## 2020-09-17 NOTE — Telephone Encounter (Signed)
Reviewed records from ER yesterday. CT c/w uncomplicated diverticulitis and patient started on Flagyl for 7 days and provided hydrocodone, 10 tablets.  Patient needs a follow-up office visit with [place], use cancellation list.  Would like to see her in the next 2 to 3 weeks.  Dr. Constance Haw instructions noted on patient's AVS was that she was going to reach out to [place] for consideration of flexible sigmoidoscopy to rule out stricture in the sigmoid colon prior to patient undergoing hernia repair.

## 2020-09-17 NOTE — Telephone Encounter (Signed)
The pt bowels are moving well with Linzess was advised of this yesterday because she didn't take it due to fact she didn't know whether or not we were going to see her yesterday.

## 2020-09-17 NOTE — Telephone Encounter (Signed)
noted 

## 2020-09-17 NOTE — ED Provider Notes (Signed)
1:01 AM Assumed care from Dr. Melina Copa, please see their note for full history, physical and decision making until this point. In brief this is a 45 y.o. year old female who presented to the ED tonight with Abdominal Pain     H/O diverticulitis with symptoms c/w same. Pending CT.   CT c/w diverticulitis. Abx/pain meds already ordered. Stable for discharge.   Discharge instructions, including strict return precautions for new or worsening symptoms, given. Patient and/or family verbalized understanding and agreement with the plan as described.   Labs, studies and imaging reviewed by myself and considered in medical decision making if ordered. Imaging interpreted by radiology.  Labs Reviewed  CBC WITH DIFFERENTIAL/PLATELET - Abnormal; Notable for the following components:      Result Value   WBC 15.0 (*)    Neutro Abs 12.0 (*)    All other components within normal limits  COMPREHENSIVE METABOLIC PANEL - Abnormal; Notable for the following components:   Potassium 3.3 (*)    Glucose, Bld 116 (*)    Total Protein 8.2 (*)    ALT 51 (*)    All other components within normal limits  URINALYSIS, ROUTINE W REFLEX MICROSCOPIC - Abnormal; Notable for the following components:   APPearance HAZY (*)    Hgb urine dipstick SMALL (*)    Bacteria, UA RARE (*)    All other components within normal limits  LIPASE, BLOOD  PREGNANCY, URINE    CT Abdomen Pelvis Wo Contrast  Final Result      No follow-ups on file.    Merrily Pew, MD 09/17/20 [phone]

## 2020-09-18 ENCOUNTER — Telehealth: Payer: Self-pay

## 2020-09-18 NOTE — Telephone Encounter (Signed)
Refer to my documentation under 09/06/20 telephone note for follow up care advised. Manuela Schwartz aware of cancellation list.

## 2020-09-18 NOTE — Telephone Encounter (Signed)
Returned the pt's call and advised her of the next steps in her follow-up care starting with a 2 or 3 week visit her and further recommendations will follow. Pt agreed. Will give to Manuela Schwartz to put on cancellation list

## 2020-09-22 NOTE — Telephone Encounter (Signed)
Pt aware of OV on 6/14

## 2020-09-29 ENCOUNTER — Telehealth: Payer: Self-pay | Admitting: Internal Medicine

## 2020-09-29 NOTE — Telephone Encounter (Signed)
Noted, will tell pt when she returns call

## 2020-09-29 NOTE — Telephone Encounter (Signed)
From Dena: "Returned the pt's call and was advised that Saturday afternoon she started having abd cramping (no Linzess was taken that day). She took it yesterday and had 2 explosives BM's. Today it is muscus like and she is doubled over in pain. She took a pain pill left over from a previous surgery to stop the abd pain. She took Linzess this morning and it has been 2 hrs and not worked. Her pain level now is a 6 but more if she tries to straighten upwards. Pt doesn't associate this with her Diverticulitis. States normally the Linzess will stop her abd pain once she use the bathroom but it has not. I advised if she is in that much pain when she stands up she really needs to go to ER to be evaluated. The pt has a appt tomorrow. Advised the pt to seek treatment at ER if she is in that much pain and if she cannot have a BM. States she will talk it over with her husband and call us back around lunch today." (I put this into the chart in case it didn't show up as it was a routing note)   I agree with ED evaluation if her pain is that severe.

## 2020-09-29 NOTE — Telephone Encounter (Signed)
Patient has OV tomorrow but wanted to be seen today. She said she was in severe pain. Please call [phone]

## 2020-09-30 ENCOUNTER — Ambulatory Visit (INDEPENDENT_AMBULATORY_CARE_PROVIDER_SITE_OTHER): Payer: PRIVATE HEALTH INSURANCE | Admitting: Gastroenterology

## 2020-09-30 ENCOUNTER — Encounter: Payer: Self-pay | Admitting: Gastroenterology

## 2020-09-30 ENCOUNTER — Other Ambulatory Visit: Payer: Self-pay

## 2020-09-30 ENCOUNTER — Telehealth: Payer: Self-pay | Admitting: Internal Medicine

## 2020-09-30 VITALS — BP 136/86 | HR 93 | Temp 96.6°F | Ht 59.0 in | Wt 254.6 lb

## 2020-09-30 DIAGNOSIS — R109 Unspecified abdominal pain: Secondary | ICD-10-CM | POA: Diagnosis not present

## 2020-09-30 DIAGNOSIS — K5732 Diverticulitis of large intestine without perforation or abscess without bleeding: Secondary | ICD-10-CM

## 2020-09-30 MED ORDER — CIPROFLOXACIN HCL 500 MG PO TABS: 500.0000 mg | ORAL_TABLET | Freq: Two times a day (BID) | ORAL | 0 refills | Status: AC

## 2020-09-30 MED ORDER — METRONIDAZOLE 500 MG PO TABS: 500.0000 mg | ORAL_TABLET | Freq: Three times a day (TID) | ORAL | 0 refills | Status: AC

## 2020-09-30 NOTE — Telephone Encounter (Signed)
Patient dropped off fmla paperwork  

## 2020-09-30 NOTE — Progress Notes (Signed)
Primary Care Physician: Marylee Floras, FNP  Primary Gastroenterologist:  Elon Alas. Abbey Chatters, DO   Chief Complaint  Patient presents with   Abdominal Pain    Seen in ED 5/31, then felt some better. Started having left lower abd pain, cramping last weekend. Since flare started hasn't had much bm. Foul odor to stool    HPI: Tiffany Snow is a 45 y.o. female here for follow-up of diverticulitis.  Patient seen in the office in April 2022.   Initial diagnosis in June 2020, CT scan with documented diverticulitis per patient.  Treated with Cipro and Flagyl.  Completed a colonoscopy with Dr. Earley Brooke in August 2020, had single hyperplastic polyp removed, moderate diverticulosis in the sigmoid colon, grade 1 internal hemorrhoids.  Prep was good.  Patient reports doing okay until October 2021 when she developed recurrent left-sided pain.  She reports at least 3 rounds of antibiotic therapy for diverticulitis between November 2021 and February 2022, 2 of those episodes with CT documented diverticulitis per patient.  Additional CT on Aug 18, 2020 with contrast showed no acute diverticulitis.  She had umbilical hernia containing edematous fat, potentially symptomatic.  Recently she was seen by Dr. Constance Haw for consideration of surgical repair of umbilical hernia.  At the time she was seen by Dr. Constance Haw, it was felt that part of the patient's pain was likely related to diverticular disease and potentially colonic stricture from chronic inflammation.  Dr. Constance Haw requested at least flexible sigmoidoscopy to look at area of concern prior to consideration of umbilical hernia repair.  Patient may ultimately require partial colectomy.  The day she was seen by Dr. Constance Haw, she has significant left lower quadrant pain on exam.  Pain progressed and she ended up in the ED, repeat CT abdomen pelvis without contrast showing uncomplicated diverticulitis involving the distal descending colon.  Treated with 7-day  course of Cipro and Flagyl.  Her ALT was 51, white blood cell count 15,000.  Today: Patient states she continues to have frequent left lower quadrant symptoms. Completed antibiotics, abdominal pain resolved but Saturday she started cramping again.  Sunday doubled over.  Took 1 pain pill she had leftover from ED visit.  States that this flare does not seem typical of her previous diverticulitis.  Bowel movements not right.  Prior to this last episode when she took [place] she would have significant bowel movement currently after taking it.  Unable to take every day while she is working because of lack of access to bathroom.  Now when she takes Linzess, 2-3 hours later explosive diarrhea few times. If no Linzess, then no BM. Feels pressure to go. Then only passes little mucous. Linzess yesteray, one explosive stool. Look like sand.  No melena or rectal bleeding.  No fever or chills.  No vomiting.     CT abdomen and pelvis with and without contrast Sep 16, 2020: IMPRESSION: 1. Findings consistent with acute non perforated diverticulitis involving the distal descending colon. 2. Hepatic steatosis  Current Outpatient Medications  Medication Sig Dispense Refill   busPIRone (BUSPAR) 15 MG tablet Take 15 mg by mouth See admin instructions. Take 1/2 tablet in the morning and 1/2 tablet at night     cetirizine (ZYRTEC) 10 MG tablet Take 10 mg by mouth daily.     citalopram (CELEXA) 40 MG tablet Take 40 mg by mouth daily.     fluticasone (FLONASE) 50 MCG/ACT nasal spray Place into both nostrils as needed for allergies or  rhinitis.     HYDROcodone-acetaminophen (NORCO/VICODIN) 5-325 MG tablet Take 1 tablet by mouth every 6 (six) hours as needed. 10 tablet 0   linaclotide (LINZESS) 145 MCG CAPS capsule Take 1 capsule (145 mcg total) by mouth daily before breakfast. 30 capsule 3   montelukast (SINGULAIR) 10 MG tablet Take 10 mg by mouth at bedtime.     Multiple Vitamin (MULTIVITAMIN) tablet Take 1 tablet by  mouth daily.     topiramate (TOPAMAX) 25 MG tablet Take 25 mg by mouth 2 (two) times daily.     No current facility-administered medications for this visit.    Allergies as of 09/30/2020 - Review Complete 09/30/2020  Allergen Reaction Noted   Avelox [moxifloxacin] Hives 07/25/2020   Other  07/25/2020   Penicillins Hives 07/25/2020   Past Medical History:  Diagnosis Date   Anxiety    Asthma    Depression    Diverticulitis    colon   Migraine    Sleep apnea    Past Surgical History:  Procedure Laterality Date   ANKLE SURGERY Left    carpel tunnel Bilateral    COLONOSCOPY  11/2018   [place]: Hyperplastic polyp, moderate sigmoid diverticulosis, grade 1 internal hemorrhoids   KNEE SURGERY     four total   NASAL SINUS SURGERY     Family History  Problem Relation Age of Onset   Hypertension Mother    Diverticulitis Mother    COPD Father    Colon cancer Neg Hx    Social History   Tobacco Use   Smoking status: Never   Smokeless tobacco: Never  Substance Use Topics   Alcohol use: Not Currently    Comment: no h/o significant etoh use. none in sever years.   Drug use: Not Currently    ROS:  General: Negative for anorexia, weight loss, fever, chills, fatigue, weakness. ENT: Negative for hoarseness, difficulty swallowing , nasal congestion. CV: Negative for chest pain, angina, palpitations, dyspnea on exertion, peripheral edema.  Respiratory: Negative for dyspnea at rest, dyspnea on exertion, cough, sputum, wheezing.  GI: See history of present illness. GU:  Negative for dysuria, hematuria, urinary incontinence, urinary frequency, nocturnal urination.  Endo: Negative for unusual weight change.    Physical Examination:   BP 136/86   Pulse 93   Temp (!) 96.6 F (35.9 C) (Temporal)   Ht 4\' 11"  (1.499 m)   Wt 254 lb 9.6 oz (115.5 kg)   BMI 51.42 kg/m   General: Well-nourished, well-developed in no acute distress.  Eyes: No icterus. Mouth: masked Lungs: Clear to  auscultation bilaterally.  Heart: Regular rate and rhythm, no murmurs rubs or gallops.  Abdomen: Bowel sounds are normal,   nondistended, no hepatosplenomegaly or masses, no abdominal bruits or hernia , no rebound or guarding.  Mild left lower quadrant tenderness Extremities: No lower extremity edema. No clubbing or deformities. Neuro: Alert and oriented x 4   Skin: Warm and dry, no jaundice.   Psych: Alert and cooperative, normal mood and affect.  Labs:  Lab Results  Component Value Date   CREATININE 0.75 09/16/2020   BUN 6 09/16/2020   NA 136 09/16/2020   K 3.3 (L) 09/16/2020   CL 103 09/16/2020   CO2 23 09/16/2020   Lab Results  Component Value Date   WBC 15.0 (H) 09/16/2020   HGB 13.8 09/16/2020   HCT 41.9 09/16/2020   MCV 99.1 09/16/2020   PLT 378 09/16/2020   Lab Results  Component Value Date  ALT 51 (H) 09/16/2020   AST 29 09/16/2020   ALKPHOS 91 09/16/2020   BILITOT 0.8 09/16/2020    Imaging Studies: CT Abdomen Pelvis Wo Contrast  Result Date: 09/17/2020 CLINICAL DATA:  Left-sided pain with nausea EXAM: CT ABDOMEN AND PELVIS WITHOUT CONTRAST TECHNIQUE: Multidetector CT imaging of the abdomen and pelvis was performed following the standard protocol without IV contrast. COMPARISON:  CT 08/18/2020 FINDINGS: Lower chest: Lung bases demonstrate no acute consolidation or effusion. Normal cardiac size Hepatobiliary: Hepatic steatosis. No calcified gallstone or biliary dilatation Pancreas: Unremarkable. No pancreatic ductal dilatation or surrounding inflammatory changes. Spleen: Normal in size without focal abnormality. Adrenals/Urinary Tract: Adrenal glands are unremarkable. Kidneys are normal, without renal calculi, focal lesion, or hydronephrosis. Bladder is unremarkable. Stomach/Bowel: The stomach is nonenlarged. No dilated small bowel. Diverticular disease of the colon. Interval wall thickening and focal inflammation at the descending colon consistent with acute  diverticulitis. Negative appendix. Vascular/Lymphatic: Nonaneurysmal aorta.  No suspicious nodes Reproductive: Uterus and bilateral adnexa are unremarkable. Other: Negative for free air or free fluid. Musculoskeletal: No acute or significant osseous findings. IMPRESSION: 1. Findings consistent with acute non perforated diverticulitis involving the distal descending colon. 2. Hepatic steatosis Electronically Signed   By: Donavan Foil M.D.   On: 09/17/2020 00:07     Assessment:  Pleasant 45 year old female with history of morbid obesity, recurrent diverticulitis presenting for follow-up.  She also has documented umbilical hernia.  Multiple episodes of documented diverticulitis in the past 1 year.  Since February 2022, she feels like she really has not ever fully recovered.  Most recent CT with uncomplicated sigmoid diverticulitis.  Recently seen by Dr. Constance Haw, general surgery, for further evaluation of umbilical hernia.  Given patient's recurrent diverticulitis and persistent left lower quadrant discomfort, Dr. Constance Haw is requesting direct visualization of the sigmoid region to exclude possible stricture contributing to symptoms.  Patient may require partial colectomy of diseased portion due to recurrent diverticulitis as well.  Patient's last colonoscopy in August 2020 as outlined above.  Patient currently has left lower quadrant discomfort.  Patient states her abdominal pain resolved on antibiotic therapy and had recurrent abdominal cramping over the weekend.  Continues to feel like bowels are not emptying well.  Left lower quadrant tenderness on exam.  Elected to extend antibiotic therapy, Cipro and Flagyl.  She is allergic to penicillin, therefore have avoided Augmentin.  She is also allergic to Avelox but tolerates Cipro.    Plan: Will complete FMLA papers due to recurrent diverticulitis and missed work with flares. Patient reports that she had COVID, tested + May 9.  D-dimer elevated, PCP  planning for CTA chest to rule out blood clots.  She will call with results. Cipro and Flagyl for 10 days. Continue Linzess daily. Consume soft diet until abdominal pain improves. Patient will need direct visualization of her sigmoid colon, flexible sigmoidoscopy versus colonoscopy.  To discuss further with Dr. Abbey Chatters and determine timing.

## 2020-09-30 NOTE — Patient Instructions (Signed)
FMLA papers to be completed. OK to pay today. Cipro 500 mg twice daily for 10 days.  Flagyl 500 mg 3 times daily for 10 days. Please let me know if CT scan shows evidence of blood clots. Consume soft diet until your abdominal pain has improved. Continue Linzess daily to aid in bowel movements. I will discuss timing of colonoscopy or sigmoidoscopy with Dr. Abbey Chatters, further recommendations to follow.

## 2020-09-30 NOTE — Telephone Encounter (Signed)
Pt here in the office.

## 2020-10-01 NOTE — Telephone Encounter (Signed)
Paperwork completed. Pt has already paid for them. Please fax to the company, keep a copy for our records and mail originals to the pt. Thank you.

## 2020-10-02 ENCOUNTER — Telehealth: Payer: Self-pay | Admitting: Internal Medicine

## 2020-10-02 ENCOUNTER — Encounter: Payer: Self-pay | Admitting: Gastroenterology

## 2020-10-02 NOTE — Telephone Encounter (Signed)
PATIENT CALLED AND SAID THAT HER CT SHOWED NO BLOOD CLOTS, SHE STATED SHE WAS TO CALL YOU AND LET YOU KNOW THE RESULTS

## 2020-10-07 ENCOUNTER — Telehealth: Payer: Self-pay | Admitting: Gastroenterology

## 2020-10-07 ENCOUNTER — Telehealth: Payer: Self-pay | Admitting: Internal Medicine

## 2020-10-07 NOTE — Telephone Encounter (Signed)
Please let pt know I have discussed her case with Dr. Abbey Chatters. He advises proceeding with colonoscopy in 4 weeks. Please schedule. ASA III. He does not want done sooner than 4 weeks due to currently being treated for diverticulitis.

## 2020-10-07 NOTE — Telephone Encounter (Signed)
Phoned and advised the pt of colonoscopy moving forward in 4 weeks and why it will be happening that way.  2. [facility] pt has specified she did not want the gallon jug of colon prep, she wants the 2 little bottles.

## 2020-10-07 NOTE — Telephone Encounter (Signed)
Please call patient, she is wanting to know the next step in her care plan.  Stated that leslie was supposed to find out if she needed a tcs

## 2020-10-08 ENCOUNTER — Other Ambulatory Visit: Payer: Self-pay

## 2020-10-08 MED ORDER — SUPREP BOWEL PREP KIT 17.5-3.13-1.6 GM/177ML PO SOLN: 1.0000 | ORAL | 0 refills | Status: DC

## 2020-10-08 NOTE — Telephone Encounter (Signed)
Tried to call pt, LMOVM for return call. 

## 2020-10-08 NOTE — Telephone Encounter (Signed)
Spoke to [place] at [employer]. Clinical notes faxed to [phone] for PA.

## 2020-10-08 NOTE — Telephone Encounter (Signed)
Pre-op appt 11/06/20. Appt letter mailed with procedure instructions.

## 2020-10-08 NOTE — Telephone Encounter (Signed)
Spoke to pt, TCS scheduled for 11/10/20 at 10:00am. Rx for Suprep sent to pharmacy per pt request. Orders entered.

## 2020-10-08 NOTE — Telephone Encounter (Signed)
[place] for [facility] to start PA for TCS.

## 2020-10-09 ENCOUNTER — Other Ambulatory Visit: Payer: Self-pay

## 2020-10-09 NOTE — Telephone Encounter (Signed)
[name] at [facility] called office, TCS approved. PA# [id number], valid date of service 11/10/20.

## 2020-10-22 ENCOUNTER — Telehealth: Payer: Self-pay

## 2020-10-22 MED ORDER — CIPROFLOXACIN HCL 500 MG PO TABS: 500.0000 mg | ORAL_TABLET | Freq: Two times a day (BID) | ORAL | 0 refills | Status: AC

## 2020-10-22 MED ORDER — METRONIDAZOLE 500 MG PO TABS: 500.0000 mg | ORAL_TABLET | Freq: Three times a day (TID) | ORAL | 0 refills | Status: AC

## 2020-10-22 NOTE — Telephone Encounter (Signed)
Spoke to patient. Did well with cipro/flagyl given after ov last month. Has been off for 10 days and recurrent diverticulitis symptoms. Her classic symptoms. Spoke with Dr. Abbey Chatters. Given penicillin allergy, will provide cipro/flagyl again. Plan to move forward with colonoscopy as scheduled on 7/25 unless her abdominal pain does not improve at which case she will call us and we will do a CT scan prior to colonoscopy.   Patient voiced understanding.

## 2020-10-22 NOTE — Addendum Note (Signed)
Addended by: Mahala Menghini on: 10/22/2020 01:38 PM   Modules accepted: Orders

## 2020-10-22 NOTE — Telephone Encounter (Signed)
noted 

## 2020-10-22 NOTE — Telephone Encounter (Signed)
Pt phoned this morning stated  she is having another flare of her diverticulitis. She had to take a pain pill last night, there is some nausea noted. Pt stated she is trying to get straightened out before her colonoscopy on the 25th of this month. Pt stated she doesn't want to go to the ER but she did mention another round of antibiotics. Please advise.

## 2020-11-04 NOTE — Patient Instructions (Signed)
Tiffany Snow  11/04/2020     @PREFPERIOPPHARMACY @   Your procedure is scheduled on  11/10/2020.   Report to [facility] at  0800  A.M.   Call this number if you have problems the morning of surgery:  [phone]   Remember:  Follow the diet and prep instructions given to you by the office.     Take these medicines the morning of surgery with A SIP OF WATER      buspar, zyrtec, celexa, hydrocodone(if needed), topamax, imitrex (if neeed).      Do not wear jewelry, make-up or nail polish.  Do not wear lotions, powders, or perfumes, or deodorant.  Do not shave 48 hours prior to surgery.  Men may shave face and neck.  Do not bring valuables to the hospital.  [facility] is not responsible for any belongings or valuables.  Contacts, dentures or bridgework may not be worn into surgery.  Leave your suitcase in the car.  After surgery it may be brought to your room.  For patients admitted to the hospital, discharge time will be determined by your treatment team.  Patients discharged the day of surgery will not be allowed to drive home and must have someone with them for 24 hours.    Special instructions:     DO NOT smoke tobacco or vape for 24 hours before your procedure.  Please read over the following fact sheets that you were given. Anesthesia Post-op Instructions and Care and Recovery After Surgery      Colonoscopy, Adult, Care After This sheet gives you information about how to care for yourself after your procedure. Your health care provider may also give you more specific instructions. If you have problems or questions, contact your health careprovider. What can I expect after the procedure? After the procedure, it is common to have: A small amount of blood in your stool for 24 hours after the procedure. Some gas. Mild cramping or bloating of your abdomen. Follow these instructions at home: Eating and drinking  Drink enough fluid to keep your urine pale  yellow. Follow instructions from your health care provider about eating or drinking restrictions. Resume your normal diet as instructed by your health care provider. Avoid heavy or fried foods that are hard to digest.  Activity Rest as told by your health care provider. Avoid sitting for a long time without moving. Get up to take short walks every 1-2 hours. This is important to improve blood flow and breathing. Ask for help if you feel weak or unsteady. Return to your normal activities as told by your health care provider. Ask your health care provider what activities are safe for you. Managing cramping and bloating  Try walking around when you have cramps or feel bloated. Apply heat to your abdomen as told by your health care provider. Use the heat source that your health care provider recommends, such as a moist heat pack or a heating pad. Place a towel between your skin and the heat source. Leave the heat on for 20-30 minutes. Remove the heat if your skin turns bright red. This is especially important if you are unable to feel pain, heat, or cold. You may have a greater risk of getting burned.  General instructions If you were given a sedative during the procedure, it can affect you for several hours. Do not drive or operate machinery until your health care provider says that it is safe. For the first  24 hours after the procedure: Do not sign important documents. Do not drink alcohol. Do your regular daily activities at a slower pace than normal. Eat soft foods that are easy to digest. Take over-the-counter and prescription medicines only as told by your health care provider. Keep all follow-up visits as told by your health care provider. This is important. Contact a health care provider if: You have blood in your stool 2-3 days after the procedure. Get help right away if you have: More than a small spotting of blood in your stool. Large blood clots in your stool. Swelling of your  abdomen. Nausea or vomiting. A fever. Increasing pain in your abdomen that is not relieved with medicine. Summary After the procedure, it is common to have a small amount of blood in your stool. You may also have mild cramping and bloating of your abdomen. If you were given a sedative during the procedure, it can affect you for several hours. Do not drive or operate machinery until your health care provider says that it is safe. Get help right away if you have a lot of blood in your stool, nausea or vomiting, a fever, or increased pain in your abdomen. This information is not intended to replace advice given to you by your health care provider. Make sure you discuss any questions you have with your healthcare provider. Document Revised: 03/30/2019 Document Reviewed: 10/30/2018 Elsevier Patient Education  [place] After This sheet gives you information about how to care for yourself after your procedure. Your health care provider may also give you more specific instructions. If you have problems or questions, contact your health careprovider. What can I expect after the procedure? After the procedure, it is common to have: Tiredness. Forgetfulness about what happened after the procedure. Impaired judgment for important decisions. Nausea or vomiting. Some difficulty with balance. Follow these instructions at home: For the time period you were told by your health care provider:     Rest as needed. Do not participate in activities where you could fall or become injured. Do not drive or use machinery. Do not drink alcohol. Do not take sleeping pills or medicines that cause drowsiness. Do not make important decisions or sign legal documents. Do not take care of children on your own. Eating and drinking Follow the diet that is recommended by your health care provider. Drink enough fluid to keep your urine pale yellow. If you vomit: Drink water,  juice, or soup when you can drink without vomiting. Make sure you have little or no nausea before eating solid foods. General instructions Have a responsible adult stay with you for the time you are told. It is important to have someone help care for you until you are awake and alert. Take over-the-counter and prescription medicines only as told by your health care provider. If you have sleep apnea, surgery and certain medicines can increase your risk for breathing problems. Follow instructions from your health care provider about wearing your sleep device: Anytime you are sleeping, including during daytime naps. While taking prescription pain medicines, sleeping medicines, or medicines that make you drowsy. Avoid smoking. Keep all follow-up visits as told by your health care provider. This is important. Contact a health care provider if: You keep feeling nauseous or you keep vomiting. You feel light-headed. You are still sleepy or having trouble with balance after 24 hours. You develop a rash. You have a fever. You have redness or swelling around the IV  site. Get help right away if: You have trouble breathing. You have new-onset confusion at home. Summary For several hours after your procedure, you may feel tired. You may also be forgetful and have poor judgment. Have a responsible adult stay with you for the time you are told. It is important to have someone help care for you until you are awake and alert. Rest as told. Do not drive or operate machinery. Do not drink alcohol or take sleeping pills. Get help right away if you have trouble breathing, or if you suddenly become confused. This information is not intended to replace advice given to you by your health care provider. Make sure you discuss any questions you have with your healthcare provider. Document Revised: 12/20/2019 Document Reviewed: 03/08/2019 Elsevier Patient Education  2022 [employer].

## 2020-11-06 ENCOUNTER — Encounter (HOSPITAL_COMMUNITY)
Admission: RE | Admit: 2020-11-06 | Discharge: 2020-11-06 | Disposition: A | Discharge status: Home / Self Care | Payer: PRIVATE HEALTH INSURANCE | Source: Ambulatory Visit | Attending: Internal Medicine | Admitting: Internal Medicine

## 2020-11-06 ENCOUNTER — Other Ambulatory Visit: Payer: Self-pay

## 2020-11-06 ENCOUNTER — Encounter (HOSPITAL_COMMUNITY): Payer: Self-pay

## 2020-11-06 DIAGNOSIS — Z01812 Encounter for preprocedural laboratory examination: Secondary | ICD-10-CM | POA: Diagnosis not present

## 2020-11-06 LAB — PREGNANCY, URINE: Preg Test, Ur: NEGATIVE

## 2020-11-10 ENCOUNTER — Encounter (HOSPITAL_COMMUNITY)
Admission: RE | Disposition: A | Discharge status: Home / Self Care | Payer: Self-pay | Source: Home / Self Care | Attending: Internal Medicine

## 2020-11-10 ENCOUNTER — Ambulatory Visit (HOSPITAL_COMMUNITY): Payer: PRIVATE HEALTH INSURANCE | Admitting: Anesthesiology

## 2020-11-10 ENCOUNTER — Other Ambulatory Visit: Payer: Self-pay

## 2020-11-10 ENCOUNTER — Encounter (HOSPITAL_COMMUNITY): Payer: Self-pay

## 2020-11-10 ENCOUNTER — Ambulatory Visit (HOSPITAL_COMMUNITY)
Admission: RE | Admit: 2020-11-10 | Discharge: 2020-11-10 | Disposition: A | Discharge status: Home / Self Care | Payer: PRIVATE HEALTH INSURANCE | Attending: Internal Medicine | Admitting: Internal Medicine

## 2020-11-10 DIAGNOSIS — Z825 Family history of asthma and other chronic lower respiratory diseases: Secondary | ICD-10-CM | POA: Diagnosis not present

## 2020-11-10 DIAGNOSIS — K5732 Diverticulitis of large intestine without perforation or abscess without bleeding: Secondary | ICD-10-CM | POA: Diagnosis not present

## 2020-11-10 DIAGNOSIS — R1032 Left lower quadrant pain: Secondary | ICD-10-CM | POA: Diagnosis present

## 2020-11-10 DIAGNOSIS — K6389 Other specified diseases of intestine: Secondary | ICD-10-CM

## 2020-11-10 DIAGNOSIS — Z888 Allergy status to other drugs, medicaments and biological substances status: Secondary | ICD-10-CM | POA: Diagnosis not present

## 2020-11-10 DIAGNOSIS — K648 Other hemorrhoids: Secondary | ICD-10-CM | POA: Insufficient documentation

## 2020-11-10 DIAGNOSIS — Z79899 Other long term (current) drug therapy: Secondary | ICD-10-CM | POA: Insufficient documentation

## 2020-11-10 DIAGNOSIS — Z8249 Family history of ischemic heart disease and other diseases of the circulatory system: Secondary | ICD-10-CM | POA: Diagnosis not present

## 2020-11-10 DIAGNOSIS — Z880 Allergy status to penicillin: Secondary | ICD-10-CM | POA: Insufficient documentation

## 2020-11-10 DIAGNOSIS — K635 Polyp of colon: Secondary | ICD-10-CM | POA: Diagnosis not present

## 2020-11-10 DIAGNOSIS — K5730 Diverticulosis of large intestine without perforation or abscess without bleeding: Secondary | ICD-10-CM | POA: Diagnosis not present

## 2020-11-10 DIAGNOSIS — D123 Benign neoplasm of transverse colon: Secondary | ICD-10-CM | POA: Insufficient documentation

## 2020-11-10 DIAGNOSIS — Z8379 Family history of other diseases of the digestive system: Secondary | ICD-10-CM | POA: Diagnosis not present

## 2020-11-10 HISTORY — DX: Nausea with vomiting, unspecified: R11.2

## 2020-11-10 HISTORY — PX: COLONOSCOPY WITH PROPOFOL: SHX5780

## 2020-11-10 HISTORY — PX: BIOPSY: SHX5522

## 2020-11-10 HISTORY — DX: Other specified postprocedural states: Z98.890

## 2020-11-10 HISTORY — PX: POLYPECTOMY: SHX5525

## 2020-11-10 SURGERY — COLONOSCOPY WITH PROPOFOL
Anesthesia: General

## 2020-11-10 MED ORDER — LACTATED RINGERS IV SOLN: INTRAVENOUS | Status: DC

## 2020-11-10 MED ORDER — STERILE WATER FOR IRRIGATION IR SOLN
Status: DC | PRN
  Administered 2020-11-10: 200 mL

## 2020-11-10 MED ORDER — PROPOFOL 10 MG/ML IV BOLUS
INTRAVENOUS | Status: DC | PRN
  Administered 2020-11-10: 170 mg via INTRAVENOUS
  Administered 2020-11-10: 30 mg via INTRAVENOUS
  Administered 2020-11-10: 20 mg via INTRAVENOUS

## 2020-11-10 MED ORDER — PROPOFOL 500 MG/50ML IV EMUL
INTRAVENOUS | Status: DC | PRN
  Administered 2020-11-10: 125 ug/kg/min via INTRAVENOUS

## 2020-11-10 NOTE — Anesthesia Procedure Notes (Signed)
Date/Time: 11/10/2020 9:17 AM Performed by: Vista Deck, CRNA Pre-anesthesia Checklist: Patient identified, Emergency Drugs available, Suction available, Timeout performed and Patient being monitored Patient Re-evaluated:Patient Re-evaluated prior to induction Oxygen Delivery Method: Nasal Cannula

## 2020-11-10 NOTE — Anesthesia Postprocedure Evaluation (Signed)
Anesthesia Post Note  Patient: Tiffany Snow  Procedure(s) Performed: COLONOSCOPY WITH PROPOFOL POLYPECTOMY BIOPSY  Patient location during evaluation: Phase II Anesthesia Type: General Level of consciousness: awake and alert and oriented Pain management: pain level controlled Vital Signs Assessment: post-procedure vital signs reviewed and stable Respiratory status: spontaneous breathing and respiratory function stable Cardiovascular status: blood pressure returned to baseline and stable Postop Assessment: no apparent nausea or vomiting Anesthetic complications: no   No notable events documented.   Last Vitals:  Vitals:   11/10/20 0825 11/10/20 0941  BP: 138/78 107/67  Pulse: 84   Resp: 13 19  Temp: 37.1 C 36.9 C  SpO2: 99% 99%    Last Pain:  Vitals:   11/10/20 0941  TempSrc: Oral  PainSc: 0-No pain                 Sabrina Keough C Elihu Milstein

## 2020-11-10 NOTE — Discharge Instructions (Addendum)
  Colonoscopy Discharge Instructions  Read the instructions outlined below and refer to this sheet in the next few weeks. These discharge instructions provide you with general information on caring for yourself after you leave the hospital. Your doctor may also give you specific instructions. While your treatment has been planned according to the most current medical practices available, unavoidable complications occasionally occur.   ACTIVITY You may resume your regular activity, but move at a slower pace for the next 24 hours.  Take frequent rest periods for the next 24 hours.  Walking will help get rid of the air and reduce the bloated feeling in your belly (abdomen).  No driving for 24 hours (because of the medicine (anesthesia) used during the test).   Do not sign any important legal documents or operate any machinery for 24 hours (because of the anesthesia used during the test).  NUTRITION Drink plenty of fluids.  You may resume your normal diet as instructed by your doctor.  Begin with a light meal and progress to your normal diet. Heavy or fried foods are harder to digest and may make you feel sick to your stomach (nauseated).  Avoid alcoholic beverages for 24 hours or as instructed.  MEDICATIONS You may resume your normal medications unless your doctor tells you otherwise.  WHAT YOU CAN EXPECT TODAY Some feelings of bloating in the abdomen.  Passage of more gas than usual.  Spotting of blood in your stool or on the toilet paper.  IF YOU HAD POLYPS REMOVED DURING THE COLONOSCOPY: No aspirin products for 7 days or as instructed.  No alcohol for 7 days or as instructed.  Eat a soft diet for the next 24 hours.  FINDING OUT THE RESULTS OF YOUR TEST Not all test results are available during your visit. If your test results are not back during the visit, make an appointment with your caregiver to find out the results. Do not assume everything is normal if you have not heard from your  caregiver or the medical facility. It is important for you to follow up on all of your test results.  SEEK IMMEDIATE MEDICAL ATTENTION IF: You have more than a spotting of blood in your stool.  Your belly is swollen (abdominal distention).  You are nauseated or vomiting.  You have a temperature over 101.  You have abdominal pain or discomfort that is severe or gets worse throughout the day.   Your colonoscopy revealed 2 polyp(s) which I removed successfully. Await pathology results, my office will contact you. I recommend repeating colonoscopy in 5 years for surveillance purposes.   You do have diverticulosis of the left side of the colon.  There was mild congestion in this area so I did take biopsies.  Given your numerous recurrences of diverticulitis, left-sided colon resection I think would be a reasonable option to prevent further episodes.  Follow-up with Dr. Constance Haw to further discuss.  I hope you have a great rest of your week!  Elon Alas. Abbey Chatters, D.O. Gastroenterology and Hepatology [facility]

## 2020-11-10 NOTE — Op Note (Signed)
[facility] Patient Name: Tiffany Snow Procedure Date: 11/10/2020 9:09 AM MRN: [id number] Date of Birth: [birth year] Attending MD: Elon Alas. Abbey Chatters DO CSN: [id number] Age: 45 Admit Type: Outpatient Procedure:                Colonoscopy Indications:              Abdominal pain in the left lower quadrant,                            Follow-up of diverticulitis Providers:                Elon Alas. Abbey Chatters, DO, Lambert Mody, Aram Candela Referring MD:              Medicines:                See the Anesthesia note for documentation of the                            administered medications Complications:            No immediate complications. Estimated Blood Loss:     Estimated blood loss was minimal. Procedure:                Pre-Anesthesia Assessment:                           - The anesthesia plan was to use monitored                            anesthesia care (MAC).                           After obtaining informed consent, the colonoscope                            was passed under direct vision. Throughout the                            procedure, the patient's blood pressure, pulse, and                            oxygen saturations were monitored continuously. The                            [id number]-HQ190L ([id number]) scope was introduced through                            the anus and advanced to the the cecum, identified                            by appendiceal orifice and ileocecal valve. The                            colonoscopy was performed without difficulty. The  patient tolerated the procedure well. The quality                            of the bowel preparation was evaluated using the                            BBPS [facility] Bowel Preparation Scale) with scores                            of: Right Colon = 3, Transverse Colon = 3 and Left                            Colon = 3 (entire mucosa seen well with no  residual                            staining, small fragments of stool or opaque                            liquid). The total BBPS score equals 9. Scope In: 9:21:32 AM Scope Out: 9:37:06 AM Scope Withdrawal Time: 0 hours 12 minutes 28 seconds  Total Procedure Duration: 0 hours 15 minutes 34 seconds  Findings:      The perianal and digital rectal examinations were normal.      Non-bleeding internal hemorrhoids were found during endoscopy.      Multiple small and large-mouthed diverticula were found in the sigmoid       colon and descending colon.      An area of mildly congested mucosa was found in the sigmoid colon.       Biopsies were taken with a cold forceps for histology.      Two sessile polyps were found in the transverse colon. The polyps were 4       to 6 mm in size. These polyps were removed with a cold snare. Resection       and retrieval were complete.      The exam was otherwise without abnormality. Impression:               - Non-bleeding internal hemorrhoids.                           - Diverticulosis in the sigmoid colon and in the                            descending colon.                           - Congested mucosa in the sigmoid colon. Biopsied.                           - Two 4 to 6 mm polyps in the transverse colon,                            removed with a cold snare. Resected and retrieved.                           -  The examination was otherwise normal. Moderate Sedation:      Per Anesthesia Care Recommendation:           - Patient has a contact number available for                            emergencies. The signs and symptoms of potential                            delayed complications were discussed with the                            patient. Return to normal activities tomorrow.                            Written discharge instructions were provided to the                            patient.                           - Resume previous diet.                            - Continue present medications.                           - Await pathology results.                           - Repeat colonoscopy in 5 years for surveillance.                           - Return to GI clinic PRN.                           - Follow up with Dr. Constance Haw to discuss colon                            resection for recurrent diverticulitis. Procedure Code(s):        --- Professional ---                           [phone], Colonoscopy, flexible; with removal of                            tumor(s), polyp(s), or other lesion(s) by snare                            technique                           26378, 5, Colonoscopy, flexible; with biopsy,                            single or multiple Diagnosis Code(s):        --- Professional ---  K64.8, Other hemorrhoids                           K63.89, Other specified diseases of intestine                           K63.5, Polyp of colon                           R10.32, Left lower quadrant pain                           K57.32, Diverticulitis of large intestine without                            perforation or abscess without bleeding                           K57.30, Diverticulosis of large intestine without                            perforation or abscess without bleeding CPT copyright 2019 American Medical Association. All rights reserved. The codes documented in this report are preliminary and upon coder review may  be revised to meet current compliance requirements. Elon Alas. Abbey Chatters, DO [place] Abbey Chatters, DO 11/10/2020 9:40:51 AM This report has been signed electronically. Number of Addenda: 0

## 2020-11-10 NOTE — H&P (Signed)
Primary Care Physician:  Tiffany Floras, FNP Primary Gastroenterologist:  Dr. Abbey Snow  Pre-Procedure History & Physical: HPI:  Tiffany Snow is a 45 y.o. female is here for a colonoscopy for recurrent diverticulitis. Initial diagnosis in June 2020, CT scan with documented diverticulitis per patient.  Treated with Cipro and Flagyl.  Completed a colonoscopy with Dr. Earley Snow in August 2020, had single hyperplastic polyp removed, moderate diverticulosis in the sigmoid colon, grade 1 internal hemorrhoids.  Prep was good.  Patient reports doing okay until October 2021 when she developed recurrent left-sided pain.  She reports at least 3 rounds of antibiotic therapy for diverticulitis between November 2021 and February 2022, 2 of those episodes with CT documented diverticulitis per patient.  Additional CT on Aug 18, 2020 with contrast showed no acute diverticulitis.  She had umbilical hernia containing edematous fat, potentially symptomatic.  Recently she was seen by Dr. Constance Snow for consideration of surgical repair of umbilical hernia.  At the time she was seen by Dr. Constance Snow, it was felt that part of the patient's pain was likely related to diverticular disease and potentially colonic stricture from chronic inflammation.  Dr. Constance Snow requested at least flexible sigmoidoscopy to look at area of concern prior to consideration of umbilical hernia repair.  Patient may ultimately require partial colectomy.  The day she was seen by Dr. Constance Snow, she has significant left lower quadrant pain on exam.  Pain progressed and she ended up in the ED, repeat CT abdomen pelvis without contrast showing uncomplicated diverticulitis involving the distal descending colon.  Treated with 7-day course of Cipro and Flagyl.  Her ALT was 51, white blood cell count 15,000.  Past Medical History:  Diagnosis Date   Anxiety    Asthma    Depression    Diverticulitis    colon   Migraine    PONV (postoperative nausea and vomiting)     Sleep apnea     Past Surgical History:  Procedure Laterality Date   ANKLE SURGERY Left    carpel tunnel Bilateral    COLONOSCOPY  11/2018   [place]: Hyperplastic polyp, moderate sigmoid diverticulosis, grade 1 internal hemorrhoids   KNEE SURGERY     four total   NASAL SINUS SURGERY      Prior to Admission medications   Medication Sig Start Date End Date Taking? Authorizing Provider  busPIRone (BUSPAR) 15 MG tablet Take 7.5 mg by mouth 2 (two) times daily. t   Yes [provider]  cetirizine (ZYRTEC) 10 MG tablet Take 10 mg by mouth in the morning.   Yes [provider]  citalopram (CELEXA) 40 MG tablet Take 40 mg by mouth in the morning.   Yes [provider]  HYDROcodone-acetaminophen (NORCO/VICODIN) 5-325 MG tablet Take 1 tablet by mouth every 6 (six) hours as needed. Patient taking differently: Take 1 tablet by mouth every 6 (six) hours as needed (pain). 09/16/20  Yes Tiffany Rasmussen, MD  linaclotide [facility]) 145 MCG CAPS capsule Take 1 capsule (145 mcg total) by mouth daily before breakfast. 08/20/20  Yes Tiffany Menghini, PA-C  medroxyPROGESTERone (DEPO-PROVERA) 150 MG/ML injection Inject 150 mg into the muscle every 3 (three) months. 09/06/20  Yes [provider]  montelukast (SINGULAIR) 10 MG tablet Take 10 mg by mouth at bedtime.   Yes [provider]  Multiple Vitamin (MULTIVITAMIN WITH MINERALS) TABS tablet Take 1 tablet by mouth in the morning.   Yes [provider]  SUMAtriptan (IMITREX) 100 MG tablet Take 100  mg by mouth 2 (two) times daily as needed for migraine. 09/24/20  Yes [provider]  topiramate (TOPAMAX) 25 MG tablet Take 25 mg by mouth 2 (two) times daily.   Yes [provider]  Na Sulfate-K Sulfate-Mg Sulf (SUPREP BOWEL PREP KIT) 17.5-3.13-1.6 GM/177ML SOLN Take 1 kit by mouth as directed. 10/08/20   Tiffany Harman, DO    Allergies as of 10/08/2020 - Review Complete 09/30/2020  Allergen  Reaction Noted   Avelox [moxifloxacin] Hives 07/25/2020   Other  07/25/2020   Penicillins Hives 07/25/2020    Family History  Problem Relation Age of Onset   Hypertension Mother    Diverticulitis Mother    COPD Father    Colon cancer Neg Hx     Social History   Socioeconomic History   Marital status: Married    Spouse name: Not on file   Number of children: Not on file   Years of education: Not on file   Highest education level: Not on file  Occupational History   Not on file  Tobacco Use   Smoking status: Never   Smokeless tobacco: Never  Vaping Use   Vaping Use: Never used  Substance and Sexual Activity   Alcohol use: Not Currently    Comment: no h/o significant etoh use. none in sever years.   Drug use: Not Currently   Sexual activity: Not on file  Other Topics Concern   Not on file  Social History Narrative   Not on file   Social Determinants of Health   Financial Resource Strain: Not on file  Food Insecurity: Not on file  Transportation Needs: Not on file  Physical Activity: Not on file  Stress: Not on file  Social Connections: Not on file  Intimate Partner Violence: Not on file    Review of Systems: See HPI, otherwise negative ROS  Physical Exam: Vital signs in last 24 hours: Temp:  [98.7 F (37.1 C)] 98.7 F (37.1 C) (07/25 0825) Pulse Rate:  [84] 84 (07/25 0825) Resp:  [13] 13 (07/25 0825) BP: (138)/(78) 138/78 (07/25 0825) SpO2:  [99 %] 99 % (07/25 0825)   General:   Alert,  Well-developed, well-nourished, pleasant and cooperative in NAD Head:  Normocephalic and atraumatic. Eyes:  Sclera clear, no icterus.   Conjunctiva pink. Ears:  Normal auditory acuity. Nose:  No deformity, discharge,  or lesions. Mouth:  No deformity or lesions, dentition normal. Neck:  Supple; no masses or thyromegaly. Lungs:  Clear throughout to auscultation.   No wheezes, crackles, or rhonchi. No acute distress. Heart:  Regular rate and rhythm; no murmurs, clicks,  rubs,  or gallops. Abdomen:  Soft, nontender and nondistended. No masses, hepatosplenomegaly or hernias noted. Normal bowel sounds, without guarding, and without rebound.   Msk:  Symmetrical without gross deformities. Normal posture. Extremities:  Without clubbing or edema. Neurologic:  Alert and  oriented x4;  grossly normal neurologically. Skin:  Intact without significant lesions or rashes. Cervical Nodes:  No significant cervical adenopathy. Psych:  Alert and cooperative. Normal mood and affect.  Impression/Plan: Tiffany Snow is here for a colonoscopy for recurrent diverticulitis.   The risks of the procedure including infection, bleed, or perforation as well as benefits, limitations, alternatives and imponderables have been reviewed with the patient. Questions have been answered. All parties agreeable.

## 2020-11-10 NOTE — Transfer of Care (Signed)
Immediate Anesthesia Transfer of Care Note  Patient: EFFA SHAKER  Procedure(s) Performed: COLONOSCOPY WITH PROPOFOL POLYPECTOMY BIOPSY  Patient Location: Short Stay  Anesthesia Type:General  Level of Consciousness: awake and patient cooperative  Airway & Oxygen Therapy: Patient Spontanous Breathing  Post-op Assessment: Report given to RN and Post -op Vital signs reviewed and stable  Post vital signs: Reviewed and stable  Last Vitals:  Vitals Value Taken Time  BP    Temp    Pulse    Resp    SpO2      Last Pain:  Vitals:   11/10/20 0918  TempSrc:   PainSc: 0-No pain      Patients Stated Pain Goal: 6 ([phone] [phone])  Complications: No notable events documented.

## 2020-11-10 NOTE — Anesthesia Preprocedure Evaluation (Signed)
Anesthesia Evaluation  Patient identified by MRN, date of birth, ID band Patient awake    Reviewed: Allergy & Precautions, NPO status , Patient's Chart, lab work & pertinent test results  History of Anesthesia Complications (+) PONV and history of anesthetic complications  Airway Mallampati: II  TM Distance: >3 FB Neck ROM: Full    Dental  (+) Dental Advisory Given, Teeth Intact   Pulmonary asthma , sleep apnea and Continuous Positive Airway Pressure Ventilation ,    Pulmonary exam normal breath sounds clear to auscultation       Cardiovascular Exercise Tolerance: Good Normal cardiovascular exam Rhythm:Regular Rate:Normal     Neuro/Psych  Headaches, PSYCHIATRIC DISORDERS Anxiety Depression    GI/Hepatic negative GI ROS, Neg liver ROS,   Endo/Other  Morbid obesity  Renal/GU negative Renal ROS     Musculoskeletal   Abdominal Normal abdominal exam  (+)   Peds  Hematology negative hematology ROS (+)   Anesthesia Other Findings   Reproductive/Obstetrics                            Anesthesia Physical Anesthesia Plan  ASA: 3  Anesthesia Plan: General   Post-op Pain Management:    Induction: Intravenous  PONV Risk Score and Plan: Propofol infusion  Airway Management Planned: Nasal Cannula and Natural Airway  Additional Equipment:   Intra-op Plan:   Post-operative Plan:   Informed Consent: I have reviewed the patients History and Physical, chart, labs and discussed the procedure including the risks, benefits and alternatives for the proposed anesthesia with the patient or authorized representative who has indicated his/her understanding and acceptance.       Plan Discussed with: CRNA and Surgeon  Anesthesia Plan Comments:         Anesthesia Quick Evaluation

## 2020-11-11 LAB — SURGICAL PATHOLOGY

## 2020-11-13 ENCOUNTER — Encounter (HOSPITAL_COMMUNITY): Payer: Self-pay | Admitting: Internal Medicine

## 2020-12-04 ENCOUNTER — Other Ambulatory Visit: Payer: Self-pay

## 2020-12-04 ENCOUNTER — Encounter: Payer: Self-pay | Admitting: General Surgery

## 2020-12-04 ENCOUNTER — Ambulatory Visit (INDEPENDENT_AMBULATORY_CARE_PROVIDER_SITE_OTHER): Payer: PRIVATE HEALTH INSURANCE | Admitting: General Surgery

## 2020-12-04 VITALS — BP 114/76 | HR 84 | Temp 98.8°F | Resp 16 | Ht 59.0 in | Wt 253.0 lb

## 2020-12-04 DIAGNOSIS — K5732 Diverticulitis of large intestine without perforation or abscess without bleeding: Secondary | ICD-10-CM | POA: Diagnosis not present

## 2020-12-04 MED ORDER — METRONIDAZOLE 500 MG PO TABS: 1000.0000 mg | ORAL_TABLET | ORAL | 0 refills | Status: DC

## 2020-12-04 MED ORDER — NEOMYCIN SULFATE 500 MG PO TABS: 1000.0000 mg | ORAL_TABLET | ORAL | 0 refills | Status: DC

## 2020-12-04 NOTE — Patient Instructions (Signed)
Colon Preparation:  Buy from the Store: Miralax bottle (288g).  Gatorade 64 oz (not red). Dulcolax tablets.   The Day Prior to Surgery: Take 4 ducolax tablets at 7am with water. Drink plenty of clear liquids all day to avoid dehydration, no solid food.    Mix the bottle of Miralax and 64 oz of Gatorade and drink this mixture starting at 10am. Drink it gradually over the next few hours, 8 ounces every 15-30 minutes until it is gone. Finish this by 2pm.  Take 2 neomycin 500mg tablets and 2 metronidazole 500mg tablets at 2 pm. Take 2 neomycin 500mg tablets and 2 metronidazole 500mg tablets at 3pm. Take 2 neomycin 500mg tablets and 2 metronidazole 500mg tablets at 10pm.    Do not eat or drink anything after midnight the night before your surgery.  Do not eat or drink anything that morning, and take medications as instructed by the hospital staff on your preoperative visit.   

## 2020-12-05 NOTE — H&P (Signed)
[facility] History and Physical   Chief Complaint   DIscuss surgery     Tiffany Snow is a 45 y.o. female.  HPI: Patient seen by me with LLQ pain and known umbilical hernia that I did not think was causing her LLQ pain. She had a colonoscopy which demonstrated congestion and diverticula.  She has had improvement in her pain after her round of antibiotics but she is worried about her recurrences. She has had over 7 episodes of diverticulitis in a few years. She wants to proceed with colectomy as we discussed that her pain was likely related to her chronic diverticulitis.  She denies any nausea or vomiting and denies any blood per rectum.   Past Medical History:  Diagnosis Date   Anxiety    Asthma    Depression    Diverticulitis    colon   Migraine    PONV (postoperative nausea and vomiting)    Sleep apnea     Past Surgical History:  Procedure Laterality Date   ANKLE SURGERY Left    BIOPSY  11/10/2020   Procedure: BIOPSY;  Surgeon: Eloise Harman, DO;  Location: [facility];  Service: Endoscopy;;   carpel tunnel Bilateral    COLONOSCOPY  11/2018   [place]and[place]: Hyperplastic polyp, moderate sigmoid diverticulosis, grade 1 internal hemorrhoids   COLONOSCOPY WITH PROPOFOL N/A 11/10/2020   Procedure: COLONOSCOPY WITH PROPOFOL;  Surgeon: Eloise Harman, DO;  Location: [facility];  Service: Endoscopy;  Laterality: N/A;  10:00am   KNEE SURGERY     four total   NASAL SINUS SURGERY     POLYPECTOMY  11/10/2020   Procedure: POLYPECTOMY;  Surgeon: Eloise Harman, DO;  Location: [facility];  Service: Endoscopy;;    Family History  Problem Relation Age of Onset   Hypertension Mother    Diverticulitis Mother    COPD Father    Colon cancer Neg Hx     Social History   Tobacco Use   Smoking status: Never   Smokeless tobacco: Never  Vaping Use   Vaping Use: Never used  Substance Use Topics   Alcohol use: Not Currently    Comment: no h/o significant  etoh use. none in sever years.   Drug use: Not Currently    Medications: I have reviewed the patient's current medications. Allergies as of 12/04/2020       Reactions   Avelox [moxifloxacin] Hives   Other    Some bandaids cause rash   Penicillins Hives        Medication List        Accurate as of December 04, 2020 11:59 PM. If you have any questions, ask your nurse or doctor.          STOP taking these medications    Suprep Bowel Prep Kit 17.5-3.13-1.6 GM/177ML Soln Generic drug: Na Sulfate-K Sulfate-Mg Sulf Stopped by: Virl Cagey, MD       TAKE these medications    busPIRone 15 MG tablet Commonly known as: BUSPAR Take 7.5 mg by mouth 2 (two) times daily. t   cetirizine 10 MG tablet Commonly known as: ZYRTEC Take 10 mg by mouth in the morning.   citalopram 40 MG tablet Commonly known as: CELEXA Take 40 mg by mouth in the morning.   HYDROcodone-acetaminophen 5-325 MG tablet Commonly known as: NORCO/VICODIN Take 1 tablet by mouth every 6 (six) hours as needed. What changed: reasons to take this   linaclotide 145 MCG Caps  capsule Commonly known as: Linzess Take 1 capsule (145 mcg total) by mouth daily before breakfast.   medroxyPROGESTERone 150 MG/ML injection Commonly known as: DEPO-PROVERA Inject 150 mg into the muscle every 3 (three) months.   metroNIDAZOLE 500 MG tablet Commonly known as: Flagyl Take 2 tablets (1,000 mg total) by mouth as directed. Take 2 flagyl 579m tablets at 2pm, 3pm, and 10 pm the day before surgery. Started by: LVirl Cagey MD   montelukast 10 MG tablet Commonly known as: SINGULAIR Take 10 mg by mouth at bedtime.   multivitamin with minerals Tabs tablet Take 1 tablet by mouth in the morning.   neomycin 500 MG tablet Commonly known as: MYCIFRADIN Take 2 tablets (1,000 mg total) by mouth as directed. Take 2 neomycin 5018mtablets at 2pm, 3pm, and 10 pm the day before surgery. Started by: LiVirl Cagey MD   SUMAtriptan 100 MG tablet Commonly known as: IMITREX Take 100 mg by mouth 2 (two) times daily as needed for migraine.   topiramate 25 MG tablet Commonly known as: TOPAMAX Take 25 mg by mouth 2 (two) times daily.         ROS:  A comprehensive review of systems was negative except for: Gastrointestinal: positive for abdominal pain  Blood pressure 114/76, pulse 84, temperature 98.8 F (37.1 C), temperature source Other (Comment), resp. rate 16, height 4' 11" (1.499 m), weight 253 lb (114.8 kg), SpO2 97 %. Physical Exam Vitals reviewed.  HENT:     Head: Normocephalic.     Nose: Nose normal.     Mouth/Throat:     Mouth: Mucous membranes are moist.  Eyes:     Extraocular Movements: Extraocular movements intact.  Cardiovascular:     Rate and Rhythm: Normal rate and regular rhythm.  Pulmonary:     Effort: Pulmonary effort is normal.     Breath sounds: Normal breath sounds.  Abdominal:     General: There is no distension.     Palpations: Abdomen is soft.     Tenderness: There is no abdominal tenderness.  Musculoskeletal:        General: Normal range of motion.  Skin:    General: Skin is warm.  Neurological:     General: No focal deficit present.     Mental Status: She is alert and oriented to person, place, and time.  Psychiatric:        Mood and Affect: Mood normal.        Behavior: Behavior normal.        Thought Content: Thought content normal.    Results: CT with sigmoid diverticulitis and inflammatory changes    Assessment & Plan:  AlTINIA ORAVECs a [age].o. female with recurrent diverticulitis with multiple episodes in the last year. She is worried about recurrences and having worsening pain. She is ready to proceed with partial colectomy. We discussed laparoscopic partial colectomy versus open and risk of bleeding, infection, anastomotic leak, injury to other organs or ureter. Discussed potential need for blood products and risk of allergic reactions and  infections.   Will plan to primary repair umbilical hernia at time of her colectomy.   Colon preparation:  Buy from the Store: Miralax bottle (288g).  Gatorade 64 oz (not red). Dulcolax tablets.   The Day Prior to Surgery: Take 4 ducolax tablets at 7am with water. Drink plenty of clear liquids all day to avoid dehydration, no solid food.    Mix the bottle of Miralax and 64  oz of Gatorade and drink this mixture starting at 10am. Drink it gradually over the next few hours, 8 ounces every 15-30 minutes until it is gone. Finish this by 2pm.  Take 2 neomycin 555m tablets and 2 metronidazole 5063mtablets at 2 pm. Take 2 neomycin 50047mablets and 2 metronidazole 500m55mblets at 3pm. Take 2 neomycin 500mg100mlets and 2 metronidazole 500mg 29mets at 10pm.    Do not eat or drink anything after midnight the night before your surgery.  Do not eat or drink anything that morning, and take medications as instructed by the hospital staff on your preoperative visit.    All questions were answered to the satisfaction of the patient and family.    LindsaVirl [email or web address], 2:08 PM

## 2020-12-05 NOTE — Progress Notes (Signed)
[facility] History and Physical   Chief Complaint   DIscuss surgery     Tiffany Snow is a 45 y.o. female.  HPI: Patient seen by me with LLQ pain and known umbilical hernia that I did not think was causing her LLQ pain. She had a colonoscopy which demonstrated congestion and diverticula.  She has had improvement in her pain after her round of antibiotics but she is worried about her recurrences. She has had over 7 episodes of diverticulitis in a few years. She wants to proceed with colectomy as we discussed that her pain was likely related to her chronic diverticulitis.  She denies any nausea or vomiting and denies any blood per rectum.   Past Medical History:  Diagnosis Date   Anxiety    Asthma    Depression    Diverticulitis    colon   Migraine    PONV (postoperative nausea and vomiting)    Sleep apnea     Past Surgical History:  Procedure Laterality Date   ANKLE SURGERY Left    BIOPSY  11/10/2020   Procedure: BIOPSY;  Surgeon: Eloise Harman, DO;  Location: [facility];  Service: Endoscopy;;   carpel tunnel Bilateral    COLONOSCOPY  11/2018   [place]: Hyperplastic polyp, moderate sigmoid diverticulosis, grade 1 internal hemorrhoids   COLONOSCOPY WITH PROPOFOL N/A 11/10/2020   Procedure: COLONOSCOPY WITH PROPOFOL;  Surgeon: Eloise Harman, DO;  Location: [facility];  Service: Endoscopy;  Laterality: N/A;  10:00am   KNEE SURGERY     four total   NASAL SINUS SURGERY     POLYPECTOMY  11/10/2020   Procedure: POLYPECTOMY;  Surgeon: Eloise Harman, DO;  Location: [facility];  Service: Endoscopy;;    Family History  Problem Relation Age of Onset   Hypertension Mother    Diverticulitis Mother    COPD Father    Colon cancer Neg Hx     Social History   Tobacco Use   Smoking status: Never   Smokeless tobacco: Never  Vaping Use   Vaping Use: Never used  Substance Use Topics   Alcohol use: Not Currently    Comment: no h/o significant  etoh use. none in sever years.   Drug use: Not Currently    Medications: I have reviewed the patient's current medications. Allergies as of 12/04/2020       Reactions   Avelox [moxifloxacin] Hives   Other    Some bandaids cause rash   Penicillins Hives        Medication List        Accurate as of December 04, 2020 11:59 PM. If you have any questions, ask your nurse or doctor.          STOP taking these medications    Suprep Bowel Prep Kit 17.5-3.13-1.6 GM/177ML Soln Generic drug: Na Sulfate-K Sulfate-Mg Sulf Stopped by: Virl Cagey, MD       TAKE these medications    busPIRone 15 MG tablet Commonly known as: BUSPAR Take 7.5 mg by mouth 2 (two) times daily. t   cetirizine 10 MG tablet Commonly known as: ZYRTEC Take 10 mg by mouth in the morning.   citalopram 40 MG tablet Commonly known as: CELEXA Take 40 mg by mouth in the morning.   HYDROcodone-acetaminophen 5-325 MG tablet Commonly known as: NORCO/VICODIN Take 1 tablet by mouth every 6 (six) hours as needed. What changed: reasons to take this   linaclotide 145 MCG Caps  capsule Commonly known as: Linzess Take 1 capsule (145 mcg total) by mouth daily before breakfast.   medroxyPROGESTERone 150 MG/ML injection Commonly known as: DEPO-PROVERA Inject 150 mg into the muscle every 3 (three) months.   metroNIDAZOLE 500 MG tablet Commonly known as: Flagyl Take 2 tablets (1,000 mg total) by mouth as directed. Take 2 flagyl 500mg tablets at 2pm, 3pm, and 10 pm the day before surgery. Started by: Lindsay C Bridges, MD   montelukast 10 MG tablet Commonly known as: SINGULAIR Take 10 mg by mouth at bedtime.   multivitamin with minerals Tabs tablet Take 1 tablet by mouth in the morning.   neomycin 500 MG tablet Commonly known as: MYCIFRADIN Take 2 tablets (1,000 mg total) by mouth as directed. Take 2 neomycin 500mg tablets at 2pm, 3pm, and 10 pm the day before surgery. Started by: Lindsay C Bridges,  MD   SUMAtriptan 100 MG tablet Commonly known as: IMITREX Take 100 mg by mouth 2 (two) times daily as needed for migraine.   topiramate 25 MG tablet Commonly known as: TOPAMAX Take 25 mg by mouth 2 (two) times daily.         ROS:  A comprehensive review of systems was negative except for: Gastrointestinal: positive for abdominal pain  Blood pressure 114/76, pulse 84, temperature 98.8 F (37.1 C), temperature source Other (Comment), resp. rate 16, height 4' 11" (1.499 m), weight 253 lb (114.8 kg), SpO2 97 %. Physical Exam Vitals reviewed.  HENT:     Head: Normocephalic.     Nose: Nose normal.     Mouth/Throat:     Mouth: Mucous membranes are moist.  Eyes:     Extraocular Movements: Extraocular movements intact.  Cardiovascular:     Rate and Rhythm: Normal rate and regular rhythm.  Pulmonary:     Effort: Pulmonary effort is normal.     Breath sounds: Normal breath sounds.  Abdominal:     General: There is no distension.     Palpations: Abdomen is soft.     Tenderness: There is no abdominal tenderness.  Musculoskeletal:        General: Normal range of motion.  Skin:    General: Skin is warm.  Neurological:     General: No focal deficit present.     Mental Status: She is alert and oriented to person, place, and time.  Psychiatric:        Mood and Affect: Mood normal.        Behavior: Behavior normal.        Thought Content: Thought content normal.    Results: CT with sigmoid diverticulitis and inflammatory changes    Assessment & Plan:  Tiffany Snow is a 44 y.o. female with recurrent diverticulitis with multiple episodes in the last year. She is worried about recurrences and having worsening pain. She is ready to proceed with partial colectomy. We discussed laparoscopic partial colectomy versus open and risk of bleeding, infection, anastomotic leak, injury to other organs or ureter. Discussed potential need for blood products and risk of allergic reactions and  infections.   Colon preparation:  Buy from the Store: Miralax bottle (288g).  Gatorade 64 oz (not red). Dulcolax tablets.   The Day Prior to Surgery: Take 4 ducolax tablets at 7am with water. Drink plenty of clear liquids all day to avoid dehydration, no solid food.    Mix the bottle of Miralax and 64 oz of Gatorade and drink this mixture starting at 10am. Drink it gradually over   the next few hours, 8 ounces every 15-30 minutes until it is gone. Finish this by 2pm.  Take 2 neomycin 500mg tablets and 2 metronidazole 500mg tablets at 2 pm. Take 2 neomycin 500mg tablets and 2 metronidazole 500mg tablets at 3pm. Take 2 neomycin 500mg tablets and 2 metronidazole 500mg tablets at 10pm.    Do not eat or drink anything after midnight the night before your surgery.  Do not eat or drink anything that morning, and take medications as instructed by the hospital staff on your preoperative visit.    All questions were answered to the satisfaction of the patient and family.    Lindsay C Bridges 12/05/2020, 2:08 PM       

## 2020-12-24 ENCOUNTER — Other Ambulatory Visit: Payer: Self-pay | Admitting: Gastroenterology

## 2020-12-28 ENCOUNTER — Other Ambulatory Visit: Payer: Self-pay

## 2020-12-28 ENCOUNTER — Emergency Department (HOSPITAL_COMMUNITY): Payer: PRIVATE HEALTH INSURANCE

## 2020-12-28 ENCOUNTER — Emergency Department (HOSPITAL_COMMUNITY)
Admission: EM | Admit: 2020-12-28 | Discharge: 2020-12-28 | Disposition: A | Discharge status: Home / Self Care | Payer: PRIVATE HEALTH INSURANCE | Attending: Emergency Medicine | Admitting: Emergency Medicine

## 2020-12-28 ENCOUNTER — Encounter (HOSPITAL_COMMUNITY): Payer: Self-pay | Admitting: Emergency Medicine

## 2020-12-28 DIAGNOSIS — K5792 Diverticulitis of intestine, part unspecified, without perforation or abscess without bleeding: Secondary | ICD-10-CM | POA: Diagnosis not present

## 2020-12-28 DIAGNOSIS — J45909 Unspecified asthma, uncomplicated: Secondary | ICD-10-CM | POA: Insufficient documentation

## 2020-12-28 DIAGNOSIS — R1032 Left lower quadrant pain: Secondary | ICD-10-CM | POA: Diagnosis present

## 2020-12-28 LAB — COMPREHENSIVE METABOLIC PANEL
ALT: 33 U/L (ref 0–44)
AST: 24 U/L (ref 15–41)
Albumin: 4.3 g/dL (ref 3.5–5.0)
Alkaline Phosphatase: 92 U/L (ref 38–126)
Anion gap: 6 (ref 5–15)
BUN: 11 mg/dL (ref 6–20)
CO2: 23 mmol/L (ref 22–32)
Calcium: 9 mg/dL (ref 8.9–10.3)
Chloride: 107 mmol/L (ref 98–111)
Creatinine, Ser: 0.8 mg/dL (ref 0.44–1.00)
GFR, Estimated: >60 mL/min (ref >60)
Glucose, Bld: 82 mg/dL (ref 70–99)
Potassium: 3.5 mmol/L (ref 3.5–5.1)
Sodium: 136 mmol/L (ref 135–145)
Total Bilirubin: 0.5 mg/dL (ref 0.3–1.2)
Total Protein: 8.2 g/dL — ABNORMAL HIGH (ref 6.5–8.1)

## 2020-12-28 LAB — URINALYSIS, ROUTINE W REFLEX MICROSCOPIC
Bilirubin Urine: NEGATIVE
Glucose, UA: NEGATIVE mg/dL
Hgb urine dipstick: NEGATIVE
Ketones, ur: NEGATIVE mg/dL
Leukocytes,Ua: NEGATIVE
Nitrite: NEGATIVE
Protein, ur: NEGATIVE mg/dL
Specific Gravity, Urine: 1.015 (ref 1.005–1.030)
pH: 6.5 (ref 5.0–8.0)

## 2020-12-28 LAB — CBC
HCT: 44.7 % (ref 36.0–46.0)
Hemoglobin: 14.5 g/dL (ref 12.0–15.0)
MCH: 31.6 pg (ref 26.0–34.0)
MCHC: 32.4 g/dL (ref 30.0–36.0)
MCV: 97.4 fL (ref 80.0–100.0)
Platelets: 389 10*3/uL (ref 150–400)
RBC: 4.59 MIL/uL (ref 3.87–5.11)
RDW: 12.6 % (ref 11.5–15.5)
WBC: 12.3 10*3/uL — ABNORMAL HIGH (ref 4.0–10.5)
nRBC: 0 % (ref 0.0–0.2)

## 2020-12-28 LAB — POC URINE PREG, ED: Preg Test, Ur: NEGATIVE

## 2020-12-28 LAB — LIPASE, BLOOD: Lipase: 36 U/L (ref 11–51)

## 2020-12-28 MED ORDER — IOHEXOL 300 MG/ML  SOLN
30.0000 mL | Freq: Once | INTRAMUSCULAR | Status: DC | PRN
Start: 2020-12-28 — End: 2020-12-28

## 2020-12-28 MED ORDER — METRONIDAZOLE 500 MG PO TABS: 500.0000 mg | ORAL_TABLET | Freq: Two times a day (BID) | ORAL | 0 refills | Status: DC

## 2020-12-28 MED ORDER — IOHEXOL 350 MG/ML SOLN: 100.0000 mL | Freq: Once | INTRAVENOUS | Status: DC | PRN

## 2020-12-28 MED ORDER — SODIUM CHLORIDE 0.9 % IV BOLUS
1000.0000 mL | Freq: Once | INTRAVENOUS | Status: AC
  Administered 2020-12-28: 1000 mL via INTRAVENOUS

## 2020-12-28 MED ORDER — IOHEXOL 300 MG/ML  SOLN
30.0000 mL | Freq: Once | INTRAMUSCULAR | Status: AC | PRN
  Administered 2020-12-28: 30 mL via ORAL

## 2020-12-28 MED ORDER — ONDANSETRON 4 MG PO TBDP: 4.0000 mg | ORAL_TABLET | Freq: Three times a day (TID) | ORAL | 0 refills | Status: DC | PRN

## 2020-12-28 MED ORDER — METRONIDAZOLE 500 MG PO TABS
500.0000 mg | ORAL_TABLET | Freq: Once | ORAL | Status: AC
  Administered 2020-12-28: 500 mg via ORAL
  Filled 2020-12-28: qty 1

## 2020-12-28 MED ORDER — HYDROMORPHONE HCL 1 MG/ML IJ SOLN
0.5000 mg | Freq: Once | INTRAMUSCULAR | Status: AC
  Administered 2020-12-28: 0.5 mg via INTRAVENOUS
  Filled 2020-12-28: qty 1

## 2020-12-28 MED ORDER — MORPHINE SULFATE (PF) 4 MG/ML IV SOLN
4.0000 mg | Freq: Once | INTRAVENOUS | Status: AC
  Administered 2020-12-28: 4 mg via INTRAVENOUS
  Filled 2020-12-28: qty 1

## 2020-12-28 MED ORDER — ONDANSETRON HCL 4 MG/2ML IJ SOLN
4.0000 mg | Freq: Once | INTRAMUSCULAR | Status: AC
  Administered 2020-12-28: 4 mg via INTRAVENOUS
  Filled 2020-12-28: qty 2

## 2020-12-28 MED ORDER — MORPHINE SULFATE (PF) 2 MG/ML IV SOLN
2.0000 mg | Freq: Once | INTRAVENOUS | Status: AC
  Administered 2020-12-28: 2 mg via INTRAVENOUS
  Filled 2020-12-28: qty 1

## 2020-12-28 MED ORDER — IOHEXOL 350 MG/ML SOLN
75.0000 mL | Freq: Once | INTRAVENOUS | Status: AC | PRN
  Administered 2020-12-28: 75 mL via INTRAVENOUS

## 2020-12-28 NOTE — Progress Notes (Signed)
Patient drinking Oral Contrast per Radiology Protocol  1315/1415  To be scanned at 1515

## 2020-12-28 NOTE — ED Provider Notes (Signed)
This patient is a 45 year old female history of diverticulitis planning for elective partial colectomy secondary to this chronic condition, presenting with increasing pain and nausea.  Mild to moderate tenderness left lower quadrant, no guarding or peritoneal signs, normal vital signs.  Will perform CT scan to rule out any abscesses or perforations antibiotics and anticipate discharge in preparation for surgery.  Patient agreeable  Medical screening examination/treatment/procedure(s) were conducted as a shared visit with non-physician practitioner(s) and myself.  I personally evaluated the patient during the encounter.  Clinical Impression:   Final diagnoses:  Diverticulitis         Noemi Chapel, MD 12/29/20 [phone]

## 2020-12-28 NOTE — Discharge Instructions (Addendum)
You were seen in the emergency department today for a flare of your diverticulitis.  Lab work looked overall reassuring.  The CT of your abdomen and pelvis that we obtained while you are here showed diverticulitis and umbilical hernia without complication.  While you were here we gave you a liter of IV fluids given that you are likely mildly dehydrated from not being able to eat or drink much over the past few days.  We also gave you pain medication and nausea medication which relieved some of your symptoms.We are discharging you from the emergency department for you to follow-up with Dr. Constance Haw for your scheduled sigmoid colectomy on 01/05/2021.  In the meantime you may return to the emergency department for any reason.  Regarding your abdominal plain please return to the emergency department if you have fever, swelling or distention of her abdomen, blood in your stool, increasing abdominal pain, intractable nausea and vomiting.

## 2020-12-28 NOTE — ED Provider Notes (Signed)
[facility] EMERGENCY DEPARTMENT Provider Note   CSN: [id number] Arrival date & time: 12/28/20  1214     History Chief Complaint  Patient presents with   Abdominal Pain    Tiffany Snow is a 45 y.o. female.  With past medical history significant for diverticulitis who presents to the emergency department with abdominal pain.  States that pain began on Friday in her left lower quadrant and has progressively worsened until presentation emergency department.  Describes the pain as intermittent, cramping, and sharp.  States when the pain comes on she is doubled over.  Abdominal pain has been associated with nausea without vomiting.  States that she has had single fever of 100.4 yesterday.  Endorses decreased appetite and decreased solid and liquid intake.  Endorses lightheadedness last night.  She is unable to tell me if she has been having diarrhea due to her use of Linzess.  Denies hematochezia, melena.   Of note she sees Dr. Curlene Labrum with [facility].  Last office visit on 12/04/2020.  Per her note patient to have sigmoid colectomy on 01/05/2021.  Last colonoscopy on 11/10/2020   Abdominal Pain Associated symptoms: fever and nausea   Associated symptoms: no constipation, no shortness of breath and no vomiting       Past Medical History:  Diagnosis Date   Anxiety    Asthma    Depression    Diverticulitis    colon   Migraine    PONV (postoperative nausea and vomiting)    Sleep apnea     Patient Active Problem List   Diagnosis Date Noted   Umbilical hernia without obstruction or gangrene 09/16/2020   Diverticulitis of colon without hemorrhage 07/25/2020   Left sided abdominal pain 07/25/2020   Nausea without vomiting 07/25/2020   Change in bowel function 07/25/2020    Past Surgical History:  Procedure Laterality Date   ANKLE SURGERY Left    BIOPSY  11/10/2020   Procedure: BIOPSY;  Surgeon: Eloise Harman, DO;  Location: [facility];  Service:  Endoscopy;;   carpel tunnel Bilateral    COLONOSCOPY  11/2018   [place]: Hyperplastic polyp, moderate sigmoid diverticulosis, grade 1 internal hemorrhoids   COLONOSCOPY WITH PROPOFOL N/A 11/10/2020   Procedure: COLONOSCOPY WITH PROPOFOL;  Surgeon: Eloise Harman, DO;  Location: [facility];  Service: Endoscopy;  Laterality: N/A;  10:00am   KNEE SURGERY     four total   NASAL SINUS SURGERY     POLYPECTOMY  11/10/2020   Procedure: POLYPECTOMY;  Surgeon: Eloise Harman, DO;  Location: [facility];  Service: Endoscopy;;     OB History   No obstetric history on file.     Family History  Problem Relation Age of Onset   Hypertension Mother    Diverticulitis Mother    COPD Father    Colon cancer Neg Hx     Social History   Tobacco Use   Smoking status: Never   Smokeless tobacco: Never  Vaping Use   Vaping Use: Never used  Substance Use Topics   Alcohol use: Not Currently    Comment: no h/o significant etoh use. none in sever years.   Drug use: Not Currently    Home Medications Prior to Admission medications   Medication Sig Start Date End Date Taking? Authorizing Provider  busPIRone (BUSPAR) 15 MG tablet Take 7.5 mg by mouth 2 (two) times daily. t    [provider]  cetirizine (ZYRTEC) 10 MG tablet Take  10 mg by mouth in the morning.    [provider]  citalopram (CELEXA) 40 MG tablet Take 40 mg by mouth in the morning.    [provider]  HYDROcodone-acetaminophen (NORCO/VICODIN) 5-325 MG tablet Take 1 tablet by mouth every 6 (six) hours as needed. Patient taking differently: Take 1 tablet by mouth every 6 (six) hours as needed (pain). 09/16/20   [name], MD  LINZESS 145 MCG CAPS capsule TAKE 1 CAPSULE BY MOUTH DAILY BEFORE BREAKFAST. 12/24/20   Annitta Needs, NP  medroxyPROGESTERone (DEPO-PROVERA) 150 MG/ML injection Inject 150 mg into the muscle every 3 (three) months. 09/06/20   [provider]  metroNIDAZOLE (FLAGYL) 500  MG tablet Take 2 tablets (1,000 mg total) by mouth as directed. Take 2 flagyl '500mg'$  tablets at 2pm, 3pm, and 10 pm the day before surgery. 12/04/20   Virl Cagey, MD  montelukast (SINGULAIR) 10 MG tablet Take 10 mg by mouth at bedtime.    [provider]  Multiple Vitamin (MULTIVITAMIN WITH MINERALS) TABS tablet Take 1 tablet by mouth in the morning.    [provider]  neomycin (MYCIFRADIN) 500 MG tablet Take 2 tablets (1,000 mg total) by mouth as directed. Take 2 neomycin '500mg'$  tablets at 2pm, 3pm, and 10 pm the day before surgery. 12/04/20   Virl Cagey, MD  SUMAtriptan (IMITREX) 100 MG tablet Take 100 mg by mouth 2 (two) times daily as needed for migraine. 09/24/20   [provider]  topiramate (TOPAMAX) 25 MG tablet Take 25 mg by mouth 2 (two) times daily.    [provider]    Allergies    Avelox [moxifloxacin], Other, and Penicillins  Review of Systems   Review of Systems  Constitutional:  Positive for appetite change and fever.  Respiratory:  Negative for shortness of breath.   Gastrointestinal:  Positive for abdominal pain and nausea. Negative for abdominal distention, blood in stool, constipation and vomiting.  Neurological:  Positive for dizziness.  All other systems reviewed and are negative.  Physical Exam Updated Vital Signs BP 114/76 (BP Location: Right Arm)   Pulse 87   Temp 98.4 F (36.9 C) (Oral)   Resp 18   Ht '4\' 11"'$  (1.499 m)   Wt 113.4 kg   SpO2 97%   BMI 50.49 kg/m   Physical Exam Vitals and nursing note reviewed.  Constitutional:      General: She is not in acute distress.    Appearance: She is well-developed. She is not toxic-appearing.  HENT:     Head: Normocephalic and atraumatic.  Eyes:     General: No scleral icterus.    Pupils: Pupils are equal, round, and reactive to light.  Cardiovascular:     Rate and Rhythm: Normal rate and regular rhythm.     Heart sounds: No murmur heard. Pulmonary:      Effort: Pulmonary effort is normal. No respiratory distress.     Breath sounds: Normal breath sounds.  Abdominal:     General: Abdomen is protuberant. Bowel sounds are increased. There is no distension.     Palpations: Abdomen is soft.     Tenderness: There is abdominal tenderness in the left lower quadrant. Negative signs include Murphy's sign, Rovsing's sign and McBurney's sign.  Skin:    General: Skin is warm and dry.     Capillary Refill: Capillary refill takes less than 2 seconds.     Findings: No rash.  Neurological:     General:  No focal deficit present.     Mental Status: She is alert and oriented to person, place, and time.  Psychiatric:        Mood and Affect: Mood normal.        Behavior: Behavior normal.        Thought Content: Thought content normal.        Judgment: Judgment normal.    ED Results / Procedures / Treatments   Labs (all labs ordered are listed, but only abnormal results are displayed) Labs Reviewed  COMPREHENSIVE METABOLIC PANEL - Abnormal; Notable for the following components:      Result Value   Total Protein 8.2 (*)    All other components within normal limits  CBC - Abnormal; Notable for the following components:   WBC 12.3 (*)    All other components within normal limits  URINALYSIS, ROUTINE W REFLEX MICROSCOPIC - Abnormal; Notable for the following components:   APPearance HAZY (*)    All other components within normal limits  LIPASE, BLOOD  POC URINE PREG, ED   EKG None  Radiology No results found.  Procedures Procedures   Medications Ordered in ED Medications  sodium chloride 0.9 % bolus 1,000 mL (has no administration in time range)  morphine 4 MG/ML injection 4 mg (has no administration in time range)  ondansetron (ZOFRAN) injection 4 mg (has no administration in time range)    ED Course  I have reviewed the triage vital signs and the nursing notes.  Pertinent labs & imaging results that were available during my care of the  patient were reviewed by me and considered in my medical decision making (see chart for details).    MDM Rules/Calculators/A&P Khadisha Crosslin is a 44 year old female who presented to emergency department for abdominal pain.  Abdominal exam without peritoneal signs.  No evidence of acute abdomen at this time.  She is well-appearing.   Her symptoms are consistent with her known diverticulitis. Labs without leukocytosis or concern for systemic infection. Hemodynamically stable while in the emergency department.  Pain controlled with morphine.  Nausea controlled with IV Zofran.  IV fluids given for decreased p.o. intake over the past few days.  At time of handoff awaiting radiology results of CT abdomen pelvis with contrast.  If there is no perforation will discharge with oral antibiotics.  She has follow-up with Dr. Constance Haw for her sigmoid colectomy on 01/05/2021.  Suella Broad, PA-C is aware of plan of care at this time.  Final Clinical Impression(s) / ED Diagnoses Final diagnoses:  Diverticulitis    Rx / DC Orders ED Discharge Orders     None        Mickie Hillier, PA-C 12/28/20 1524    Noemi Chapel, MD 12/29/20 [phone]

## 2020-12-28 NOTE — ED Triage Notes (Signed)
Pt here for a diverticulitis flare up. States she is supposed to have surgery in 1 week due to the increased number of flare ups she has been having. States symptoms began on Friday and is c/o generalized abd pain, nausea, and loss of appetite.

## 2020-12-31 NOTE — Patient Instructions (Signed)
Care order/instruction: Buy from the Store: Miralax bottle [phone]). Gatorade 64 oz (not red). Dulcolax tablets. The Day Prior to Surgery: Take 4 ducolax tablets at 7am with water. Drink plenty of clear liquids all day to avoid dehydration, no solid... (Order [id number]) Nursing Held Date: 12/05/2020 Department: [facility] Ordering/Authorizing: Virl Cagey, MD   Pending Order Information  This order is signed and held for [place] with Virl Cagey, MD on 01/05/2021 at  7:15 AM     Virl Cagey, MD NPI: [id number]    Patient Information  Patient Name  Tiffany, Snow Legal Sex  Female DOB  [birth year] SSN  [id number]   Order Information  Order Date/Time Release Date/Time Start Date/Time End Date/Time  None None 12/05/20 02:21 PM None   Order History Inpatient Date/Time Action Taken User Additional Information  12/05/20 1421 Sign and Hold Virl Cagey, MD Reason:RN Will Release   Order Questions       Comments  Buy from the Store:  Miralax bottle (288g).  Gatorade 64 oz (not red).  Dulcolax tablets.   The Day Prior to Surgery:  Take 4 ducolax tablets at 7am with water.  Drink plenty of clear liquids all day to avoid dehydration, no solid food.     Mix the bottle of Miralax and 64 oz of Gatorade and drink this mixture starting at 10am. Drink it gradually over the next few hours, 8 ounces every 15-30 minutes until it is gone. Finish this by 2pm.  Take 2 neomycin '500mg'$  tablets and 2 metronidazole '500mg'$  tablets at 2 pm.  Take 2 neomycin '500mg'$  tablets and 2 metronidazole '500mg'$  tablets at 3pm.  Take 2 neomycin '500mg'$  tablets and 2 metronidazole '500mg'$  tablets at 10pm. Drink 2 bottles of carb drink before you go to bed.   You may have clear liquids until 0330 am.  At 0330 AM, drink 1 bottle of carb drink and then nothing else to drink after this, except for the medications that we want you to  take.           Tiffany Snow  12/31/2020     '@PREFPERIOPPHARMACY'$ @   Your procedure is scheduled on  01/05/2021.   Report to [facility] at   615 A.M.   Call this number if you have problems the morning of surgery:  [phone]   Remember:  Follow the prep instructions above as given to you by Dr Constance Haw.   You may drink clear liquids until  0330 AM.  At 0330 AM, drink 1 carb drink. Clear liquids allowed are:                    Water, Juice (non-citric and without pulp - diabetics please choose diet or no sugar options), Carbonated beverages - (diabetics please choose diet or no sugar options), Clear Tea, Black Coffee only (no creamer, milk or cream including half and half), Plain Jell-O only (diabetics please choose diet or no sugar options), Gatorade (diabetics please choose diet or no sugar options), and Plain Popsicles only    Take these medicines the morning of surgery with A SIP OF WATER       buspar, zyrtec, celexa, hydrocodone (if needed), zofran (if needed), imitrex (if needed), topamax.      Do not wear jewelry, make-up or nail polish.  Do not wear lotions, powders, or perfumes, or deodorant.  Do not shave 48 hours prior to surgery.  Men may shave face and  neck.  Do not bring valuables to the hospital.  [facility] is not responsible for any belongings or valuables.  Contacts, dentures or bridgework may not be worn into surgery.  Leave your suitcase in the car.  After surgery it may be brought to your room.  For patients admitted to the hospital, discharge time will be determined by your treatment team.  Patients discharged the day of surgery will not be allowed to drive home.    Special instructions:   DO NOT smoke tobacco or vape for 24 hours before your procedure.  Please read over the following fact sheets that you were given. Pain Booklet, Coughing and Deep Breathing, Blood Transfusion Information, Surgical Site Infection Prevention, Anesthesia  Post-op Instructions, and Care and Recovery After Surgery      Minimally Invasive Total Colectomy, Care After This sheet gives you information about how to care for yourself after your procedure. Your health care provider may also give you more specific instructions. If you have problems or questions, contact your health care provider. What can I expect after the procedure? After your procedure, it is common to have the following: Pain in your abdomen, especially in the incision areas. You will be given medicine to control the pain. Tiredness. This is a normal part of the recovery process. Your energy level will return to normal over the next several weeks. Changes in your bowel movements, especially having bowel movements more often. Talk with your health care provider about how to manage this. Follow these instructions at home: Medicines Take over-the-counter and prescription medicines only as told by your health care provider. If you were prescribed an antibiotic medicine, take it as told by your health care provider. Do not stop using the antibiotic even if you start to feel better. Ask your health care provider if the medicine prescribed to you requires you to avoid driving or using machinery. Eating and drinking Follow instructions from your health care provider about what you can eat after surgery. You may be asked to begin with a diet that is low in fiber. Do not drink alcohol if: Your health care provider tells you not to drink. You are pregnant, may be pregnant, or are planning to become pregnant. If you drink alcohol: Limit how much you use to: 0-1 drink a day for women. 0-2 drinks a day for men. Be aware of how much alcohol is in your drink. In the [place] one drink equals one 12 oz bottle of beer (355 mL), one 5 oz glass of wine (148 mL), or one 1 oz glass of hard liquor (44 mL). Incision care  Follow instructions from your health care provider about how to take care of your  incisions. Make sure you: Wash your hands with soap and water for at least 20 seconds before and after applying medicine to the area, and before and after changing your bandage (dressing). If soap and water are not available, use hand sanitizer. Change your dressing as told by your health care provider. Leave stitches (sutures) or staples in place. These skin closures may need to stay in place for 2 weeks or longer. If adhesive strip edges start to loosen and curl up, you may trim the loose edges. Do not remove adhesive strips completely unless your health care provider tells you to do that. Keep your incisions clean and dry. Do not wear tight clothing over the incisions. Tight clothing may rub and irritate the incision areas, which may cause the incisions to open. Do  not take baths, swim, or use a hot tub until your health care provider approves. Ask your health care provider if you may take showers. You may only be allowed to take sponge baths. Check your incision area every day for signs of infection. Check for: More redness, swelling, or pain. Fluid or blood. Warmth. Pus or a bad smell. Activity Rest as told by your health care provider. Avoid sitting for a long time without moving. Get up to take short walks every 1-2 hours. This is important to improve blood flow and breathing. Ask for help if you feel weak or unsteady. Do not lift anything that is heavier than 10 lb (4.5 kg), or the limit that you are told, until your health care provider says that it is safe. Return to your normal activities as told by your health care provider. Ask your health care provider what activities are safe for you. General instructions Keep all follow-up visits as told by your health care provider. This is important. You may need a follow-up visit to remove sutures or staples. Contact a health care provider if: You have more redness, swelling, or pain around your incisions. You have fluid or blood coming from  the incisions. Your incisions feel warm to the touch. You have pus or a bad smell coming from your incisions or your dressing. You have a fever. Your incisions break open after sutures or staples have been removed. Get help right away if: You develop a rash. You have chest pain or difficulty breathing. You have pain or swelling in your legs. You feel light-headed or you faint. Your abdomen swells (becomes distended). You have nausea or vomiting. You have blood in your stool. Summary After the procedure, it is common to have pain in the abdomen, tiredness, or changes in bowel movements. Follow instructions from your health care provider about how to care for your incisions. Begin your diet with low-fiber foods. Your health care provider will tell you when to resume your regular diet. Contact a health care provider if you have any signs of infection, such as a fever, more redness, swelling, or pain around your incisions, or a bad smell coming from your incisions. Get help right away if you have a rash, nausea, chest pain, pain or swelling in your legs, or blood in your stool. This information is not intended to replace advice given to you by your health care provider. Make sure you discuss any questions you have with your health care provider. Document Revised: 03/14/2019 Document Reviewed: 03/14/2019 Elsevier Patient Education  2022 [place] Anesthesia, Adult, Care After This sheet gives you information about how to care for yourself after your procedure. Your health care provider may also give you more specific instructions. If you have problems or questions, contact your health care provider. What can I expect after the procedure? After the procedure, the following side effects are common: Pain or discomfort at the IV site. Nausea. Vomiting. Sore throat. Trouble concentrating. Feeling cold or chills. Feeling weak or tired. Sleepiness and fatigue. Soreness and body  aches. These side effects can affect parts of the body that were not involved in surgery. Follow these instructions at home: For the time period you were told by your health care provider:  Rest. Do not participate in activities where you could fall or become injured. Do not drive or use machinery. Do not drink alcohol. Do not take sleeping pills or medicines that cause drowsiness. Do not make important decisions or sign legal  documents. Do not take care of children on your own. Eating and drinking Follow any instructions from your health care provider about eating or drinking restrictions. When you feel hungry, start by eating small amounts of foods that are soft and easy to digest (bland), such as toast. Gradually return to your regular diet. Drink enough fluid to keep your urine pale yellow. If you vomit, rehydrate by drinking water, juice, or clear broth. General instructions If you have sleep apnea, surgery and certain medicines can increase your risk for breathing problems. Follow instructions from your health care provider about wearing your sleep device: Anytime you are sleeping, including during daytime naps. While taking prescription pain medicines, sleeping medicines, or medicines that make you drowsy. Have a responsible adult stay with you for the time you are told. It is important to have someone help care for you until you are awake and alert. Return to your normal activities as told by your health care provider. Ask your health care provider what activities are safe for you. Take over-the-counter and prescription medicines only as told by your health care provider. If you smoke, do not smoke without supervision. Keep all follow-up visits as told by your health care provider. This is important. Contact a health care provider if: You have nausea or vomiting that does not get better with medicine. You cannot eat or drink without vomiting. You have pain that does not get better  with medicine. You are unable to pass urine. You develop a skin rash. You have a fever. You have redness around your IV site that gets worse. Get help right away if: You have difficulty breathing. You have chest pain. You have blood in your urine or stool, or you vomit blood. Summary After the procedure, it is common to have a sore throat or nausea. It is also common to feel tired. Have a responsible adult stay with you for the time you are told. It is important to have someone help care for you until you are awake and alert. When you feel hungry, start by eating small amounts of foods that are soft and easy to digest (bland), such as toast. Gradually return to your regular diet. Drink enough fluid to keep your urine pale yellow. Return to your normal activities as told by your health care provider. Ask your health care provider what activities are safe for you. This information is not intended to replace advice given to you by your health care provider. Make sure you discuss any questions you have with your health care provider. Document Revised: 12/20/2019 Document Reviewed: 07/19/2019 Elsevier Patient Education  2022 [place]. How to Use Chlorhexidine for Bathing Chlorhexidine gluconate (CHG) is a germ-killing (antiseptic) solution that is used to clean the skin. It can get rid of the bacteria that normally live on the skin and can keep them away for about 24 hours. To clean your skin with CHG, you may be given: A CHG solution to use in the shower or as part of a sponge bath. A prepackaged cloth that contains CHG. Cleaning your skin with CHG may help lower the risk for infection: While you are staying in the intensive care unit of the hospital. If you have a vascular access, such as a central line, to provide short-term or long-term access to your veins. If you have a catheter to drain urine from your bladder. If you are on a ventilator. A ventilator is a machine that helps you  breathe by moving air in and  out of your lungs. After surgery. What are the risks? Risks of using CHG include: A skin reaction. Hearing loss, if CHG gets in your ears and you have a perforated eardrum. Eye injury, if CHG gets in your eyes and is not rinsed out. The CHG product catching fire. Make sure that you avoid smoking and flames after applying CHG to your skin. Do not use CHG: If you have a chlorhexidine allergy or have previously reacted to chlorhexidine. On babies younger than 73 months of age. How to use CHG solution Use CHG only as told by your health care provider, and follow the instructions on the label. Use the full amount of CHG as directed. Usually, this is one bottle. During a shower Follow these steps when using CHG solution during a shower (unless your health care provider gives you different instructions): Start the shower. Use your normal soap and shampoo to wash your face and hair. Turn off the shower or move out of the shower stream. Pour the CHG onto a clean washcloth. Do not use any type of brush or rough-edged sponge. Starting at your neck, lather your body down to your toes. Make sure you follow these instructions: If you will be having surgery, pay special attention to the part of your body where you will be having surgery. Scrub this area for at least 1 minute. Do not use CHG on your head or face. If the solution gets into your ears or eyes, rinse them well with water. Avoid your genital area. Avoid any areas of skin that have broken skin, cuts, or scrapes. Scrub your back and under your arms. Make sure to wash skin folds. Let the lather sit on your skin for 1-2 minutes or as long as told by your health care provider. Thoroughly rinse your entire body in the shower. Make sure that all body creases and crevices are rinsed well. Dry off with a clean towel. Do not put any substances on your body afterward--such as powder, lotion, or perfume--unless you are told  to do so by your health care provider. Only use lotions that are recommended by the manufacturer. Put on clean clothes or pajamas. If it is the night before your surgery, sleep in clean sheets.  During a sponge bath Follow these steps when using CHG solution during a sponge bath (unless your health care provider gives you different instructions): Use your normal soap and shampoo to wash your face and hair. Pour the CHG onto a clean washcloth. Starting at your neck, lather your body down to your toes. Make sure you follow these instructions: If you will be having surgery, pay special attention to the part of your body where you will be having surgery. Scrub this area for at least 1 minute. Do not use CHG on your head or face. If the solution gets into your ears or eyes, rinse them well with water. Avoid your genital area. Avoid any areas of skin that have broken skin, cuts, or scrapes. Scrub your back and under your arms. Make sure to wash skin folds. Let the lather sit on your skin for 1-2 minutes or as long as told by your health care provider. Using a different clean, wet washcloth, thoroughly rinse your entire body. Make sure that all body creases and crevices are rinsed well. Dry off with a clean towel. Do not put any substances on your body afterward--such as powder, lotion, or perfume--unless you are told to do so by your health care provider. Only  use lotions that are recommended by the manufacturer. Put on clean clothes or pajamas. If it is the night before your surgery, sleep in clean sheets. How to use CHG prepackaged cloths Only use CHG cloths as told by your health care provider, and follow the instructions on the label. Use the CHG cloth on clean, dry skin. Do not use the CHG cloth on your head or face unless your health care provider tells you to. When washing with the CHG cloth: Avoid your genital area. Avoid any areas of skin that have broken skin, cuts, or scrapes. Before  surgery Follow these steps when using a CHG cloth to clean before surgery (unless your health care provider gives you different instructions): Using the CHG cloth, vigorously scrub the part of your body where you will be having surgery. Scrub using a back-and-forth motion for 3 minutes. The area on your body should be completely wet with CHG when you are done scrubbing. Do not rinse. Discard the cloth and let the area air-dry. Do not put any substances on the area afterward, such as powder, lotion, or perfume. Put on clean clothes or pajamas. If it is the night before your surgery, sleep in clean sheets.  For general bathing Follow these steps when using CHG cloths for general bathing (unless your health care provider gives you different instructions). Use a separate CHG cloth for each area of your body. Make sure you wash between any folds of skin and between your fingers and toes. Wash your body in the following order, switching to a new cloth after each step: The front of your neck, shoulders, and chest. Both of your arms, under your arms, and your hands. Your stomach and groin area, avoiding the genitals. Your right leg and foot. Your left leg and foot. The back of your neck, your back, and your buttocks. Do not rinse. Discard the cloth and let the area air-dry. Do not put any substances on your body afterward--such as powder, lotion, or perfume--unless you are told to do so by your health care provider. Only use lotions that are recommended by the manufacturer. Put on clean clothes or pajamas. Contact a health care provider if: Your skin gets irritated after scrubbing. You have questions about using your solution or cloth. You swallow any chlorhexidine. Call your local poison control center (1-810-514-8124 in the [place] Get help right away if: Your eyes itch badly, or they become very red or swollen. Your skin itches badly and is red or swollen. Your hearing changes. You have trouble  seeing. You have swelling or tingling in your mouth or throat. You have trouble breathing. These symptoms may represent a serious problem that is an emergency. Do not wait to see if the symptoms will go away. Get medical help right away. Call your local emergency services (911 in the [place] Do not drive yourself to the hospital. Summary Chlorhexidine gluconate (CHG) is a germ-killing (antiseptic) solution that is used to clean the skin. Cleaning your skin with CHG may help to lower your risk for infection. You may be given CHG to use for bathing. It may be in a bottle or in a prepackaged cloth to use on your skin. Carefully follow your health care provider's instructions and the instructions on the product label. Do not use CHG if you have a chlorhexidine allergy. Contact your health care provider if your skin gets irritated after scrubbing. This information is not intended to replace advice given to you by your health care provider.  Make sure you discuss any questions you have with your health care provider. Document Revised: 06/16/2020 Document Reviewed: 06/16/2020 Elsevier Patient Education  2022 [place].           Reference Links          Standing Order Information  Remaining Occurrences Interval     1/1 Once               Care order/instruction: Buy from the Store: Miralax bottle (288g). Gatorade 64 oz (not red). Dulcolax tablets. The Day Prior to Surgery: Take 4 ducolax tablets at 7am with water. Drink plenty of clear liquids all day to avoid dehydration, no solid...: Patient Communication   Not Released  Not seen     Collection Information          Encounter  View Encounter            Tracking Links  Cosign Tracking Order Transmittal Tracking

## 2021-01-02 ENCOUNTER — Other Ambulatory Visit: Payer: Self-pay

## 2021-01-02 ENCOUNTER — Other Ambulatory Visit (HOSPITAL_COMMUNITY)
Admission: RE | Admit: 2021-01-02 | Discharge: 2021-01-02 | Disposition: A | Discharge status: Home / Self Care | Payer: PRIVATE HEALTH INSURANCE | Source: Ambulatory Visit | Attending: General Surgery | Admitting: General Surgery

## 2021-01-02 ENCOUNTER — Encounter (HOSPITAL_COMMUNITY)
Admission: RE | Admit: 2021-01-02 | Discharge: 2021-01-02 | Disposition: A | Discharge status: Home / Self Care | Payer: PRIVATE HEALTH INSURANCE | Source: Ambulatory Visit | Attending: General Surgery | Admitting: General Surgery

## 2021-01-02 DIAGNOSIS — Z20822 Contact with and (suspected) exposure to covid-19: Secondary | ICD-10-CM | POA: Insufficient documentation

## 2021-01-02 DIAGNOSIS — Z01812 Encounter for preprocedural laboratory examination: Secondary | ICD-10-CM | POA: Insufficient documentation

## 2021-01-02 DIAGNOSIS — Z01818 Encounter for other preprocedural examination: Secondary | ICD-10-CM | POA: Insufficient documentation

## 2021-01-02 LAB — TYPE AND SCREEN
ABO/RH(D): A POS
Antibody Screen: NEGATIVE

## 2021-01-02 LAB — HEMOGLOBIN A1C
Hgb A1c MFr Bld: 5.8 % — ABNORMAL HIGH (ref 4.8–5.6)
Mean Plasma Glucose: 119.76 mg/dL

## 2021-01-02 LAB — HCG, SERUM, QUALITATIVE: Preg, Serum: NEGATIVE

## 2021-01-02 LAB — SARS CORONAVIRUS 2 (TAT 6-24 HRS): SARS Coronavirus 2: NEGATIVE

## 2021-01-02 LAB — PREPARE RBC (CROSSMATCH)

## 2021-01-05 ENCOUNTER — Inpatient Hospital Stay (HOSPITAL_COMMUNITY): Payer: PRIVATE HEALTH INSURANCE | Admitting: Anesthesiology

## 2021-01-05 ENCOUNTER — Encounter (HOSPITAL_COMMUNITY): Payer: Self-pay | Admitting: General Surgery

## 2021-01-05 ENCOUNTER — Encounter (HOSPITAL_COMMUNITY)
Admission: RE | Disposition: A | Discharge status: Home / Self Care | Payer: Self-pay | Source: Home / Self Care | Attending: General Surgery

## 2021-01-05 ENCOUNTER — Inpatient Hospital Stay (HOSPITAL_COMMUNITY)
Admission: RE | Admit: 2021-01-05 | Discharge: 2021-01-09 | DRG: 330 | Disposition: A | Discharge status: Home / Self Care | Payer: PRIVATE HEALTH INSURANCE | Attending: General Surgery | Admitting: General Surgery

## 2021-01-05 ENCOUNTER — Other Ambulatory Visit: Payer: Self-pay

## 2021-01-05 DIAGNOSIS — F32A Depression, unspecified: Secondary | ICD-10-CM | POA: Diagnosis present

## 2021-01-05 DIAGNOSIS — K5732 Diverticulitis of large intestine without perforation or abscess without bleeding: Secondary | ICD-10-CM

## 2021-01-05 DIAGNOSIS — Z825 Family history of asthma and other chronic lower respiratory diseases: Secondary | ICD-10-CM

## 2021-01-05 DIAGNOSIS — K429 Umbilical hernia without obstruction or gangrene: Secondary | ICD-10-CM | POA: Diagnosis present

## 2021-01-05 DIAGNOSIS — Z91048 Other nonmedicinal substance allergy status: Secondary | ICD-10-CM

## 2021-01-05 DIAGNOSIS — G4730 Sleep apnea, unspecified: Secondary | ICD-10-CM | POA: Diagnosis present

## 2021-01-05 DIAGNOSIS — Z20822 Contact with and (suspected) exposure to covid-19: Secondary | ICD-10-CM | POA: Diagnosis present

## 2021-01-05 DIAGNOSIS — J45909 Unspecified asthma, uncomplicated: Secondary | ICD-10-CM | POA: Diagnosis present

## 2021-01-05 DIAGNOSIS — Z883 Allergy status to other anti-infective agents status: Secondary | ICD-10-CM

## 2021-01-05 DIAGNOSIS — F419 Anxiety disorder, unspecified: Secondary | ICD-10-CM | POA: Diagnosis present

## 2021-01-05 DIAGNOSIS — Z880 Allergy status to penicillin: Secondary | ICD-10-CM | POA: Diagnosis not present

## 2021-01-05 DIAGNOSIS — Z79899 Other long term (current) drug therapy: Secondary | ICD-10-CM

## 2021-01-05 DIAGNOSIS — Z6841 Body Mass Index (BMI) 40.0 and over, adult: Secondary | ICD-10-CM | POA: Diagnosis not present

## 2021-01-05 DIAGNOSIS — G43909 Migraine, unspecified, not intractable, without status migrainosus: Secondary | ICD-10-CM | POA: Diagnosis present

## 2021-01-05 HISTORY — PX: LAPAROSCOPIC PARTIAL COLECTOMY: SHX5907

## 2021-01-05 LAB — ABO/RH: ABO/RH(D): A POS

## 2021-01-05 SURGERY — LAPAROSCOPIC PARTIAL COLECTOMY
Anesthesia: General | Site: Abdomen

## 2021-01-05 MED ORDER — LACTATED RINGERS IV SOLN: INTRAVENOUS | Status: DC

## 2021-01-05 MED ORDER — SCOPOLAMINE 1 MG/3DAYS TD PT72
1.0000 | MEDICATED_PATCH | TRANSDERMAL | Status: DC
  Administered 2021-01-05: 1.5 mg via TRANSDERMAL

## 2021-01-05 MED ORDER — ACETAMINOPHEN 500 MG PO TABS
1000.0000 mg | ORAL_TABLET | ORAL | Status: AC
  Administered 2021-01-05: 1000 mg via ORAL
  Filled 2021-01-05: qty 2

## 2021-01-05 MED ORDER — SUMATRIPTAN SUCCINATE 50 MG PO TABS: 100.0000 mg | ORAL_TABLET | Freq: Two times a day (BID) | ORAL | Status: DC | PRN

## 2021-01-05 MED ORDER — BUPIVACAINE LIPOSOME 1.3 % IJ SUSP
INTRAMUSCULAR | Status: AC
  Filled 2021-01-05: qty 20

## 2021-01-05 MED ORDER — FENTANYL CITRATE PF 50 MCG/ML IJ SOSY
PREFILLED_SYRINGE | INTRAMUSCULAR | Status: AC
  Filled 2021-01-05: qty 1

## 2021-01-05 MED ORDER — CHLORHEXIDINE GLUCONATE CLOTH 2 % EX PADS: 6.0000 | MEDICATED_PAD | Freq: Once | CUTANEOUS | Status: DC

## 2021-01-05 MED ORDER — SUGAMMADEX SODIUM 500 MG/5ML IV SOLN
INTRAVENOUS | Status: DC | PRN
  Administered 2021-01-05: 200 mg via INTRAVENOUS

## 2021-01-05 MED ORDER — SUCCINYLCHOLINE CHLORIDE 200 MG/10ML IV SOSY
PREFILLED_SYRINGE | INTRAVENOUS | Status: DC | PRN
  Administered 2021-01-05: 140 mg via INTRAVENOUS

## 2021-01-05 MED ORDER — CITALOPRAM HYDROBROMIDE 20 MG PO TABS
40.0000 mg | ORAL_TABLET | Freq: Every morning | ORAL | Status: DC
  Administered 2021-01-06 – 2021-01-09 (×4): 40 mg via ORAL
  Filled 2021-01-05 (×4): qty 2

## 2021-01-05 MED ORDER — FENTANYL CITRATE PF 50 MCG/ML IJ SOSY
50.0000 ug | PREFILLED_SYRINGE | INTRAMUSCULAR | Status: DC | PRN
  Administered 2021-01-05: 50 ug via INTRAVENOUS

## 2021-01-05 MED ORDER — KETOROLAC TROMETHAMINE 30 MG/ML IJ SOLN
INTRAMUSCULAR | Status: AC
  Filled 2021-01-05: qty 1

## 2021-01-05 MED ORDER — 0.9 % SODIUM CHLORIDE (POUR BTL) OPTIME
TOPICAL | Status: DC | PRN
  Administered 2021-01-05 (×3): 1000 mL

## 2021-01-05 MED ORDER — MIDAZOLAM HCL 2 MG/2ML IJ SOLN
INTRAMUSCULAR | Status: AC
  Filled 2021-01-05: qty 2

## 2021-01-05 MED ORDER — KETOROLAC TROMETHAMINE 30 MG/ML IJ SOLN
30.0000 mg | Freq: Four times a day (QID) | INTRAMUSCULAR | Status: DC
  Administered 2021-01-05 – 2021-01-08 (×11): 30 mg via INTRAVENOUS
  Filled 2021-01-05 (×11): qty 1

## 2021-01-05 MED ORDER — LIDOCAINE HCL (CARDIAC) PF 50 MG/5ML IV SOSY
PREFILLED_SYRINGE | INTRAVENOUS | Status: DC | PRN
  Administered 2021-01-05: 100 mg via INTRAVENOUS

## 2021-01-05 MED ORDER — METOCLOPRAMIDE HCL 5 MG/ML IJ SOLN: 10.0000 mg | Freq: Four times a day (QID) | INTRAMUSCULAR | Status: DC | PRN

## 2021-01-05 MED ORDER — CLINDAMYCIN PHOSPHATE 900 MG/50ML IV SOLN
900.0000 mg | INTRAVENOUS | Status: AC
  Administered 2021-01-05: 900 mg via INTRAVENOUS
  Filled 2021-01-05: qty 50

## 2021-01-05 MED ORDER — KETOROLAC TROMETHAMINE 30 MG/ML IJ SOLN
INTRAMUSCULAR | Status: DC | PRN
  Administered 2021-01-05: 30 mg via INTRAVENOUS

## 2021-01-05 MED ORDER — ROCURONIUM BROMIDE 10 MG/ML (PF) SYRINGE
PREFILLED_SYRINGE | INTRAVENOUS | Status: AC
  Filled 2021-01-05: qty 10

## 2021-01-05 MED ORDER — DIPHENHYDRAMINE HCL 25 MG PO CAPS: 25.0000 mg | ORAL_CAPSULE | Freq: Four times a day (QID) | ORAL | Status: DC | PRN

## 2021-01-05 MED ORDER — ALUM & MAG HYDROXIDE-SIMETH 200-200-20 MG/5ML PO SUSP: 30.0000 mL | Freq: Four times a day (QID) | ORAL | Status: DC | PRN

## 2021-01-05 MED ORDER — LORATADINE 10 MG PO TABS
10.0000 mg | ORAL_TABLET | Freq: Every day | ORAL | Status: DC
  Administered 2021-01-06 – 2021-01-09 (×4): 10 mg via ORAL
  Filled 2021-01-05 (×5): qty 1

## 2021-01-05 MED ORDER — ORAL CARE MOUTH RINSE: 15.0000 mL | Freq: Once | OROMUCOSAL | Status: AC

## 2021-01-05 MED ORDER — GABAPENTIN 300 MG PO CAPS
300.0000 mg | ORAL_CAPSULE | Freq: Two times a day (BID) | ORAL | Status: DC
  Administered 2021-01-05 – 2021-01-09 (×8): 300 mg via ORAL
  Filled 2021-01-05 (×8): qty 1

## 2021-01-05 MED ORDER — CHLORHEXIDINE GLUCONATE CLOTH 2 % EX PADS
6.0000 | MEDICATED_PAD | Freq: Every day | CUTANEOUS | Status: DC
  Administered 2021-01-06 – 2021-01-09 (×4): 6 via TOPICAL

## 2021-01-05 MED ORDER — HEPARIN SODIUM (PORCINE) 5000 UNIT/ML IJ SOLN
5000.0000 [IU] | Freq: Once | INTRAMUSCULAR | Status: AC
  Administered 2021-01-05: 5000 [IU] via SUBCUTANEOUS
  Filled 2021-01-05: qty 1

## 2021-01-05 MED ORDER — ACETAMINOPHEN 500 MG PO TABS
1000.0000 mg | ORAL_TABLET | Freq: Four times a day (QID) | ORAL | Status: DC
  Administered 2021-01-05 – 2021-01-09 (×12): 1000 mg via ORAL
  Filled 2021-01-05 (×14): qty 2

## 2021-01-05 MED ORDER — SCOPOLAMINE 1 MG/3DAYS TD PT72
MEDICATED_PATCH | TRANSDERMAL | Status: AC
  Filled 2021-01-05: qty 1

## 2021-01-05 MED ORDER — ENOXAPARIN SODIUM 40 MG/0.4ML IJ SOSY
40.0000 mg | PREFILLED_SYRINGE | INTRAMUSCULAR | Status: DC
  Administered 2021-01-06 – 2021-01-09 (×4): 40 mg via SUBCUTANEOUS
  Filled 2021-01-05 (×4): qty 0.4

## 2021-01-05 MED ORDER — ONDANSETRON HCL 4 MG/2ML IJ SOLN
4.0000 mg | Freq: Once | INTRAMUSCULAR | Status: AC
  Administered 2021-01-05: 4 mg via INTRAVENOUS

## 2021-01-05 MED ORDER — CHLORHEXIDINE GLUCONATE 0.12 % MT SOLN: 15.0000 mL | Freq: Once | OROMUCOSAL | Status: AC

## 2021-01-05 MED ORDER — ENSURE PRE-SURGERY PO LIQD: 592.0000 mL | Freq: Once | ORAL | Status: DC

## 2021-01-05 MED ORDER — BUSPIRONE HCL 5 MG PO TABS
7.5000 mg | ORAL_TABLET | Freq: Two times a day (BID) | ORAL | Status: DC
  Administered 2021-01-05 – 2021-01-09 (×8): 7.5 mg via ORAL
  Filled 2021-01-05 (×8): qty 2

## 2021-01-05 MED ORDER — PROPOFOL 500 MG/50ML IV EMUL
INTRAVENOUS | Status: DC | PRN
  Administered 2021-01-05 (×2): 25 ug/kg/min via INTRAVENOUS

## 2021-01-05 MED ORDER — CLINDAMYCIN PHOSPHATE 900 MG/50ML IV SOLN
900.0000 mg | Freq: Three times a day (TID) | INTRAVENOUS | Status: AC
  Administered 2021-01-05: 900 mg via INTRAVENOUS
  Filled 2021-01-05: qty 50

## 2021-01-05 MED ORDER — ONDANSETRON HCL 4 MG PO TABS: 4.0000 mg | ORAL_TABLET | Freq: Four times a day (QID) | ORAL | Status: DC | PRN

## 2021-01-05 MED ORDER — GENTAMICIN SULFATE 40 MG/ML IJ SOLN
5.0000 mg/kg | INTRAVENOUS | Status: AC
  Administered 2021-01-05: 570 mg via INTRAVENOUS
  Filled 2021-01-05: qty 14.25

## 2021-01-05 MED ORDER — ENSURE PRE-SURGERY PO LIQD: 296.0000 mL | Freq: Once | ORAL | Status: DC

## 2021-01-05 MED ORDER — ONDANSETRON HCL 4 MG/2ML IJ SOLN
4.0000 mg | Freq: Four times a day (QID) | INTRAMUSCULAR | Status: DC | PRN
  Administered 2021-01-05: 4 mg via INTRAVENOUS
  Filled 2021-01-05: qty 2

## 2021-01-05 MED ORDER — ONDANSETRON HCL 4 MG/2ML IJ SOLN
INTRAMUSCULAR | Status: AC
  Filled 2021-01-05: qty 2

## 2021-01-05 MED ORDER — ALVIMOPAN 12 MG PO CAPS
12.0000 mg | ORAL_CAPSULE | Freq: Two times a day (BID) | ORAL | Status: DC
  Administered 2021-01-06 – 2021-01-07 (×3): 12 mg via ORAL
  Filled 2021-01-05 (×3): qty 1

## 2021-01-05 MED ORDER — SIMETHICONE 80 MG PO CHEW: 40.0000 mg | CHEWABLE_TABLET | Freq: Four times a day (QID) | ORAL | Status: DC | PRN

## 2021-01-05 MED ORDER — PROPOFOL 10 MG/ML IV BOLUS
INTRAVENOUS | Status: AC
  Filled 2021-01-05: qty 60

## 2021-01-05 MED ORDER — ALVIMOPAN 12 MG PO CAPS
12.0000 mg | ORAL_CAPSULE | ORAL | Status: AC
  Administered 2021-01-05: 12 mg via ORAL
  Filled 2021-01-05: qty 1

## 2021-01-05 MED ORDER — ROCURONIUM BROMIDE 100 MG/10ML IV SOLN
INTRAVENOUS | Status: DC | PRN
  Administered 2021-01-05: 20 mg via INTRAVENOUS
  Administered 2021-01-05: 40 mg via INTRAVENOUS

## 2021-01-05 MED ORDER — MORPHINE SULFATE (PF) 2 MG/ML IV SOLN: 2.0000 mg | INTRAVENOUS | Status: DC | PRN

## 2021-01-05 MED ORDER — ENSURE SURGERY PO LIQD
237.0000 mL | Freq: Two times a day (BID) | ORAL | Status: DC
  Administered 2021-01-06 – 2021-01-08 (×5): 237 mL via ORAL

## 2021-01-05 MED ORDER — BUPIVACAINE LIPOSOME 1.3 % IJ SUSP
INTRAMUSCULAR | Status: DC | PRN
  Administered 2021-01-05: 20 mL

## 2021-01-05 MED ORDER — MIDAZOLAM HCL 2 MG/2ML IJ SOLN
2.0000 mg | Freq: Once | INTRAMUSCULAR | Status: AC
  Administered 2021-01-05: 2 mg via INTRAVENOUS

## 2021-01-05 MED ORDER — TOPIRAMATE 25 MG PO TABS
25.0000 mg | ORAL_TABLET | Freq: Two times a day (BID) | ORAL | Status: DC
  Administered 2021-01-05 – 2021-01-09 (×8): 25 mg via ORAL
  Filled 2021-01-05 (×8): qty 1

## 2021-01-05 MED ORDER — MONTELUKAST SODIUM 10 MG PO TABS
10.0000 mg | ORAL_TABLET | Freq: Every day | ORAL | Status: DC
  Administered 2021-01-05 – 2021-01-08 (×4): 10 mg via ORAL
  Filled 2021-01-05 (×4): qty 1

## 2021-01-05 MED ORDER — METOPROLOL TARTRATE 5 MG/5ML IV SOLN: 5.0000 mg | Freq: Four times a day (QID) | INTRAVENOUS | Status: DC | PRN

## 2021-01-05 MED ORDER — DEXAMETHASONE SODIUM PHOSPHATE 10 MG/ML IJ SOLN
INTRAMUSCULAR | Status: DC | PRN
  Administered 2021-01-05: 10 mg via INTRAVENOUS

## 2021-01-05 MED ORDER — CHLORHEXIDINE GLUCONATE 0.12 % MT SOLN
OROMUCOSAL | Status: AC
  Administered 2021-01-05: 15 mL via OROMUCOSAL
  Filled 2021-01-05: qty 15

## 2021-01-05 MED ORDER — FENTANYL CITRATE (PF) 250 MCG/5ML IJ SOLN
INTRAMUSCULAR | Status: AC
  Filled 2021-01-05: qty 5

## 2021-01-05 MED ORDER — DEXAMETHASONE SODIUM PHOSPHATE 10 MG/ML IJ SOLN
INTRAMUSCULAR | Status: AC
  Filled 2021-01-05: qty 1

## 2021-01-05 MED ORDER — LIDOCAINE HCL (PF) 2 % IJ SOLN
INTRAMUSCULAR | Status: AC
  Filled 2021-01-05: qty 5

## 2021-01-05 MED ORDER — FENTANYL CITRATE (PF) 100 MCG/2ML IJ SOLN
INTRAMUSCULAR | Status: DC | PRN
  Administered 2021-01-05: 100 ug via INTRAVENOUS
  Administered 2021-01-05: 50 ug via INTRAVENOUS
  Administered 2021-01-05: 25 ug via INTRAVENOUS
  Administered 2021-01-05: 100 ug via INTRAVENOUS
  Administered 2021-01-05: 50 ug via INTRAVENOUS
  Administered 2021-01-05: 100 ug via INTRAVENOUS
  Administered 2021-01-05: 25 ug via INTRAVENOUS

## 2021-01-05 MED ORDER — DIPHENHYDRAMINE HCL 50 MG/ML IJ SOLN: 25.0000 mg | Freq: Four times a day (QID) | INTRAMUSCULAR | Status: DC | PRN

## 2021-01-05 MED ORDER — PROPOFOL 10 MG/ML IV BOLUS
INTRAVENOUS | Status: DC | PRN
Start: 2021-01-05 — End: 2021-01-05
  Administered 2021-01-05: 20 mg via INTRAVENOUS
  Administered 2021-01-05: 180 mg via INTRAVENOUS

## 2021-01-05 MED ORDER — ZOLPIDEM TARTRATE 5 MG PO TABS: 5.0000 mg | ORAL_TABLET | Freq: Every evening | ORAL | Status: DC | PRN

## 2021-01-05 MED ORDER — ONDANSETRON HCL 4 MG/2ML IJ SOLN
INTRAMUSCULAR | Status: DC | PRN
  Administered 2021-01-05: 4 mg via INTRAVENOUS

## 2021-01-05 SURGICAL SUPPLY — 48 items
BAG HAMPER (MISCELLANEOUS) ×2 IMPLANT
CHLORAPREP W/TINT 26 (MISCELLANEOUS) ×2 IMPLANT
COVER LIGHT HANDLE STERIS (MISCELLANEOUS) ×4 IMPLANT
DRSG OPSITE POSTOP 4X8 (GAUZE/BANDAGES/DRESSINGS) ×2 IMPLANT
DRSG TEGADERM 2-3/8X2-3/4 SM (GAUZE/BANDAGES/DRESSINGS) ×6 IMPLANT
ELECT BLADE 6 FLAT ULTRCLN (ELECTRODE) ×2 IMPLANT
ELECT REM PT RETURN 9FT ADLT (ELECTROSURGICAL) ×2
ELECTRODE REM PT RTRN 9FT ADLT (ELECTROSURGICAL) ×1 IMPLANT
GAUZE SPONGE 4X4 12PLY STRL LF (GAUZE/BANDAGES/DRESSINGS) ×2 IMPLANT
GLOVE SURG ENC MOIS LTX SZ6.5 (GLOVE) ×4 IMPLANT
GLOVE SURG LTX SZ6.5 (GLOVE) ×2 IMPLANT
GLOVE SURG POLYISO LF SZ7.5 (GLOVE) ×4 IMPLANT
GLOVE SURG UNDER POLY LF SZ6.5 (GLOVE) ×4 IMPLANT
GLOVE SURG UNDER POLY LF SZ7 (GLOVE) ×12 IMPLANT
GOWN STRL REUS W/TWL LRG LVL3 (GOWN DISPOSABLE) ×16 IMPLANT
INST SET LAPROSCOPIC AP (KITS) ×2 IMPLANT
INST SET MAJOR GENERAL (KITS) ×2 IMPLANT
KIT TURNOVER CYSTO (KITS) ×2 IMPLANT
LIGASURE LAP ATLAS 10MM 37CM (INSTRUMENTS) ×2 IMPLANT
MANIFOLD NEPTUNE II (INSTRUMENTS) ×2 IMPLANT
NEEDLE HYPO 18GX1.5 BLUNT FILL (NEEDLE) ×2 IMPLANT
NS IRRIG 1000ML POUR BTL (IV SOLUTION) ×6 IMPLANT
PACK COLON (CUSTOM PROCEDURE TRAY) ×2 IMPLANT
PAD ARMBOARD 7.5X6 YLW CONV (MISCELLANEOUS) ×2 IMPLANT
PENCIL SMOKE EVACUATOR COATED (MISCELLANEOUS) ×2 IMPLANT
RELOAD PROXIMATE 75MM BLUE (ENDOMECHANICALS) ×4 IMPLANT
RETRACTOR WND ALEXIS-O 25 LRG (MISCELLANEOUS) ×1 IMPLANT
RETRACTOR WOUND ALXS 18CM MED (MISCELLANEOUS) ×1 IMPLANT
RTRCTR WOUND ALEXIS O 18CM MED (MISCELLANEOUS) ×2
RTRCTR WOUND ALEXIS O 25CM LRG (MISCELLANEOUS) ×2
SET TUBE SMOKE EVAC HIGH FLOW (TUBING) ×2 IMPLANT
SHEET LAVH (DRAPES) ×2 IMPLANT
SLEEVE ENDOPATH XCEL 5M (ENDOMECHANICALS) ×2 IMPLANT
SPONGE GAUZE 2X2 8PLY STRL LF (GAUZE/BANDAGES/DRESSINGS) ×6 IMPLANT
SPONGE T-LAP 18X18 ~~LOC~~+RFID (SPONGE) ×4 IMPLANT
STAPLER GUN LINEAR PROX 60 (STAPLE) ×4 IMPLANT
STAPLER PROXIMATE 75MM BLUE (STAPLE) ×2 IMPLANT
STAPLER VISISTAT (STAPLE) ×2 IMPLANT
SUT PDS AB 0 CTX 60 (SUTURE) ×4 IMPLANT
SUT PDS AB CT VIOLET #0 27IN (SUTURE) ×4 IMPLANT
SUT SILK 3 0 SH CR/8 (SUTURE) ×4 IMPLANT
SYR 20ML LL LF (SYRINGE) ×2 IMPLANT
TRAY FOLEY MTR SLVR 16FR STAT (SET/KITS/TRAYS/PACK) ×2 IMPLANT
TROCAR ENDO BLADELESS 11MM (ENDOMECHANICALS) ×2 IMPLANT
TROCAR XCEL NON-BLD 5MMX100MML (ENDOMECHANICALS) ×2 IMPLANT
TROCAR XCEL UNIV SLVE 11M 100M (ENDOMECHANICALS) ×2 IMPLANT
WARMER LAPAROSCOPE (MISCELLANEOUS) ×2 IMPLANT
YANKAUER SUCT BULB TIP 10FT TU (MISCELLANEOUS) ×4 IMPLANT

## 2021-01-05 NOTE — Anesthesia Preprocedure Evaluation (Signed)
Anesthesia Evaluation  Patient identified by MRN, date of birth, ID band Patient awake    Reviewed: Allergy & Precautions, H&P , NPO status , Patient's Chart, lab work & pertinent test results, reviewed documented beta blocker date and time   History of Anesthesia Complications (+) PONV  Airway Mallampati: II  TM Distance: >3 FB Neck ROM: full    Dental no notable dental hx. (+) Teeth Intact   Pulmonary asthma , sleep apnea ,    Pulmonary exam normal breath sounds clear to auscultation       Cardiovascular Exercise Tolerance: Good negative cardio ROS   Rhythm:regular Rate:Normal     Neuro/Psych  Headaches, PSYCHIATRIC DISORDERS Anxiety Depression    GI/Hepatic negative GI ROS, Neg liver ROS,   Endo/Other  negative endocrine ROS  Renal/GU negative Renal ROS  negative genitourinary   Musculoskeletal   Abdominal   Peds  Hematology negative hematology ROS (+)   Anesthesia Other Findings   Reproductive/Obstetrics negative OB ROS                             Anesthesia Physical Anesthesia Plan  ASA: 3  Anesthesia Plan: General   Post-op Pain Management:    Induction:   PONV Risk Score and Plan: Propofol infusion  Airway Management Planned:   Additional Equipment:   Intra-op Plan:   Post-operative Plan:   Informed Consent: I have reviewed the patients History and Physical, chart, labs and discussed the procedure including the risks, benefits and alternatives for the proposed anesthesia with the patient or authorized representative who has indicated his/her understanding and acceptance.     Dental Advisory Given  Plan Discussed with: CRNA  Anesthesia Plan Comments:         Anesthesia Quick Evaluation

## 2021-01-05 NOTE — Anesthesia Postprocedure Evaluation (Signed)
Anesthesia Post Note  Patient: Tiffany Snow  Procedure(s) Performed: LAPAROSCOPIC PARTIAL COLECTOMY (Abdomen)  Patient location during evaluation: Phase II Anesthesia Type: General Level of consciousness: awake Pain management: pain level controlled Vital Signs Assessment: post-procedure vital signs reviewed and stable Respiratory status: spontaneous breathing and respiratory function stable Cardiovascular status: blood pressure returned to baseline and stable Postop Assessment: no headache and no apparent nausea or vomiting Anesthetic complications: no Comments: Late entry   No notable events documented.   Last Vitals:  Vitals:   01/05/21 1445 01/05/21 1500  BP: 125/78 126/78  Pulse: 95 98  Resp: 19 19  Temp:    SpO2: 92% 94%    Last Pain:  Vitals:   01/05/21 1500  TempSrc:   PainSc: [place]

## 2021-01-05 NOTE — Progress Notes (Signed)
[facility]  Patient's husband updated surgery completed. Laparoscopic partial colectomy.   Curlene Labrum, MD [facility] [address] [place], [place] [address] [phone] (office)

## 2021-01-05 NOTE — Op Note (Signed)
[facility] Operative Note  01/05/21  Preoperative Diagnosis: Diverticulitis of sigmoid colon   Postoperative Diagnosis: Same   Procedure(s) Performed: Laparoscopic partial colectomy with extracorporeal side to side anastomosis, splenic flexure take down    Surgeon: Lanell Matar. Constance Haw, MD   Assistants: Aviva Signs, MD     Anesthesia: General endotracheal   Anesthesiologist: Louann Sjogren, MD    Specimens: Sigmoid colon, suture proximal    Estimated Blood Loss: Minimal   Blood Replacement: None    Complications: None   Wound Class: Clean contaminated    Operative Indications: Tiffany Snow is a 45 yo with diverticulitis with multiple episodes that continue to cause her issues with pain, time outside of work, and have been becoming more frequent in nature. We discussed elective partial colectomy given her recurrences and the disruption of life and pain associated with the episodes. We discussed bleeding, infection, anastomotic leak, ureter injury, colostomy, and needing an open procedure   Findings: Sigmoid diverticulitis with thickened colon    Procedure: The patient was taken to the operating room and placed supine. General endotracheal anesthesia was induced. Intravenous antibiotics were administered per protocol.  A foley catheter was placed. She was then placed in lithotomy position with the right arm tucked and all pressure points padded. An orogastric tube positioned to decompress the stomach. The abdomen and perineum were prepared and draped in the usual sterile fashion.   A supraumbilical incision was made and a Veress needle was inserted. The saline drop test and low insufflation pressures confirmed intraperitoneal location. A 11 mm trocar was placed under direct visualization. No injury was noted inferiorly. Her known umbilical hernia had minor omental adhesions. An additional 11 mm trocar was placed in the right lower quadrant mid clavicular line and a  5 mm trocar was placed in the right mid abdomen mid clavicular line.  With the camera from the right port, a ligasure was used to ligate the omentum.  An additional 5 mm trocar was placed in the left lower quadrant for my assistant.    The sigmoid colon was inspected. She was placed in Trendelenburg position with the left side up. The sigmoid colon was the only portion that was inflamed and thickened. She had a portion of distal sigmoid/ colorectal junction that was healthy and healthy descending colon.  I mobilized the white line of Toldt on the descending colon with the Ligasure, protecting the ureter.  We then placed her in reverse Trendelenberg and mobilized the splenic flexure with the ligasure, working up the lateral wall and then across the transverse colon, opening the lesser sac. The splenic flexure was fully mobilized.  The patient had adequate length of colorectal stump and remaining descending colon for an extracorporal side to side anastomosis. She was placed back into some degree of Trendelenburg to help with the anastomosis.   Pneumoperitoneum was release. The supraumbilical incision was extended down and a wound protector was placed for the extracorporeal anastomosis and resection.  The remaining trocars were removed. The small bowel was packed into the right upper quadrant.   The thickened sigmoid colon was easily elevated out of the abdomen cavity, and a proximal point of transection was taken with a linear cutting 75 mm stapler. The distal point of transection was also taken with a linear cutting 75 mm stapler.  The mesentery was taken with a Ligasure, ensuring that the ureter was protected.  The two ends were healthy and without signs of diverticulitis or diverticula. They were brought  together without tension in a side to side position. Using Alis clamps to hold the edges, colotomies were made and a linear 75 mm stapler was used to create the common channel. The common colotomy was closed  with a TA 60 stapler.  The suture line was oversewn in the lembert fashion with 3-0 Silk sutures, and two crotch sutures were placed with 3-0 Silk sutures.   The colon laid easily in the pelvis without tension. Irrigation was performed. Hemostasis was achieved. The omentum was brought down over the anastomosis.    The entire team changed gown and gloves and new closing equipment and drapes were used. The midline extraction site was closed with a running 0 PDS suture. The skin of the midline and port sites were closed with staples. Sterile dressings were placed.   Final inspection revealed acceptable hemostasis. All counts were correct at the end of the case. The patient was awakened from anesthesia and extubated without complication.  The patient went to the PACU in stable condition.   Tiffany Labrum, MD [facility] [address] [place], [place] [address] [phone] (office)

## 2021-01-05 NOTE — Interval H&P Note (Signed)
History and Physical Interval Note:  01/05/2021 10:54 AM  Tiffany Snow  has presented today for surgery, with the diagnosis of Diverticulitis.  The various methods of treatment have been discussed with the patient and family. After consideration of risks, benefits and other options for treatment, the patient has consented to  Procedure(s): LAPAROSCOPIC PARTIAL COLECTOMY (N/A) as a surgical intervention.  The patient's history has been reviewed, patient examined, no change in status, stable for surgery.  I have reviewed the patient's chart and labs.  Questions were answered to the patient's satisfaction.    Episode of diverticulitis and had antibiotics, did bowel prep. Just finished antibiotics. Warned against possibility of open procedure and colostomy given recent inflammation. She wants to proceed.  Virl Cagey

## 2021-01-05 NOTE — Transfer of Care (Signed)
Immediate Anesthesia Transfer of Care Note  Patient: Tiffany Snow  Procedure(s) Performed: LAPAROSCOPIC PARTIAL COLECTOMY (Abdomen)  Patient Location: PACU  Anesthesia Type:General  Level of Consciousness: awake and patient cooperative  Airway & Oxygen Therapy: Patient Spontanous Breathing and Patient connected to nasal cannula oxygen  Post-op Assessment: Report given to RN and Post -op Vital signs reviewed and stable  Post vital signs: Reviewed and stable  Last Vitals:  Vitals Value Taken Time  BP 111/81 01/05/21 1432  Temp 98.3   Pulse 96 01/05/21 1439  Resp 21 01/05/21 1439  SpO2 92 % 01/05/21 1439  Vitals shown include unvalidated device data.  Last Pain:  Vitals:   01/05/21 1010  TempSrc: Oral  PainSc: 0-No pain      Patients Stated Pain Goal: 5 ([id number])  Complications: No notable events documented.

## 2021-01-05 NOTE — Anesthesia Procedure Notes (Signed)
Procedure Name: Intubation Date/Time: 01/05/2021 12:01 PM Performed by: Vista Deck, CRNA Pre-anesthesia Checklist: Patient identified, Patient being monitored, Timeout performed, Emergency Drugs available and Suction available Patient Re-evaluated:Patient Re-evaluated prior to induction Oxygen Delivery Method: Circle System Utilized Preoxygenation: Pre-oxygenation with 100% oxygen Induction Type: IV induction Ventilation: Mask ventilation without difficulty Laryngoscope Size: Mac and 3 Grade View: Grade I Tube type: Oral Tube size: 7.0 mm Number of attempts: 1 Airway Equipment and Method: stylet Placement Confirmation: ETT inserted through vocal cords under direct vision, positive ETCO2 and breath sounds checked- equal and bilateral Secured at: 22 cm Tube secured with: Tape Dental Injury: Teeth and Oropharynx as per pre-operative assessment

## 2021-01-06 ENCOUNTER — Encounter (HOSPITAL_COMMUNITY): Payer: Self-pay | Admitting: General Surgery

## 2021-01-06 LAB — CBC WITH DIFFERENTIAL/PLATELET
Abs Immature Granulocytes: 0.11 10*3/uL — ABNORMAL HIGH (ref 0.00–0.07)
Basophils Absolute: 0 10*3/uL (ref 0.0–0.1)
Basophils Relative: 0 %
Eosinophils Absolute: 0 10*3/uL (ref 0.0–0.5)
Eosinophils Relative: 0 %
HCT: 39.1 % (ref 36.0–46.0)
Hemoglobin: 12.8 g/dL (ref 12.0–15.0)
Immature Granulocytes: 1 %
Lymphocytes Relative: 6 %
Lymphs Abs: 0.8 10*3/uL (ref 0.7–4.0)
MCH: 31.4 pg (ref 26.0–34.0)
MCHC: 32.7 g/dL (ref 30.0–36.0)
MCV: 96.1 fL (ref 80.0–100.0)
Monocytes Absolute: 1.2 10*3/uL — ABNORMAL HIGH (ref 0.1–1.0)
Monocytes Relative: 8 %
Neutro Abs: 12.8 10*3/uL — ABNORMAL HIGH (ref 1.7–7.7)
Neutrophils Relative %: 85 %
Platelets: 402 10*3/uL — ABNORMAL HIGH (ref 150–400)
RBC: 4.07 MIL/uL (ref 3.87–5.11)
RDW: 12.8 % (ref 11.5–15.5)
WBC: 14.9 10*3/uL — ABNORMAL HIGH (ref 4.0–10.5)
nRBC: 0 % (ref 0.0–0.2)

## 2021-01-06 LAB — PHOSPHORUS: Phosphorus: 3.9 mg/dL (ref 2.5–4.6)

## 2021-01-06 LAB — BASIC METABOLIC PANEL
Anion gap: 11 (ref 5–15)
BUN: 5 mg/dL — ABNORMAL LOW (ref 6–20)
CO2: 19 mmol/L — ABNORMAL LOW (ref 22–32)
Calcium: 8.8 mg/dL — ABNORMAL LOW (ref 8.9–10.3)
Chloride: 108 mmol/L (ref 98–111)
Creatinine, Ser: 0.86 mg/dL (ref 0.44–1.00)
GFR, Estimated: >60 mL/min (ref >60)
Glucose, Bld: 147 mg/dL — ABNORMAL HIGH (ref 70–99)
Potassium: 4 mmol/L (ref 3.5–5.1)
Sodium: 138 mmol/L (ref 135–145)

## 2021-01-06 LAB — MAGNESIUM: Magnesium: 1.8 mg/dL (ref 1.7–2.4)

## 2021-01-06 MED ORDER — MAGNESIUM SULFATE 2 GM/50ML IV SOLN
2.0000 g | Freq: Once | INTRAVENOUS | Status: AC
  Administered 2021-01-06: 2 g via INTRAVENOUS
  Filled 2021-01-06: qty 50

## 2021-01-06 NOTE — Progress Notes (Signed)
[facility] Progress Note  1 Day Post-Op  Subjective: Doing fair. Having some soreness. No Bms or flatus.   Objective: Vital signs in last 24 hours: Temp:  [97.7 F (36.5 C)-99.6 F (37.6 C)] 99.6 F (37.6 C) (09/20 1222) Pulse Rate:  [84-98] 94 (09/20 1222) Resp:  [17-20] 18 (09/20 1222) BP: (111-129)/(62-81) 129/62 (09/20 1222) SpO2:  [92 %-100 %] 97 % (09/20 1222) FiO2 (%):  [21 %] 21 % (09/20 1222) Weight:  [110 kg-119.1 kg] 119.1 kg (09/20 0700) Last BM Date: 01/04/21  Intake/Output from previous day: 09/19 0701 - 09/20 0700 In: 2564.4 [I.V.:2400.2; IV Piggyback:164.3] Out: 125 [Urine:100; Blood:25] Intake/Output this shift: Total I/O In: 240 [P.O.:240] Out: -   General appearance: alert, cooperative, and no distress Resp: clear to auscultation bilaterally GI: soft, nondistended, appropriately tender, staples intact with honeycomb  Lab Results:  Recent Labs    01/06/21 0459  WBC 14.9*  HGB 12.8  HCT 39.1  PLT 402*   BMET Recent Labs    01/06/21 0459  NA 138  K 4.0  CL 108  CO2 19*  GLUCOSE 147*  BUN 5*  CREATININE 0.86  CALCIUM 8.8*   PT/INR No results for input(s): LABPROT, INR in the last 72 hours.  Studies/Results: No results found.  Anti-infectives: Anti-infectives (From admission, onward)    Start     Dose/Rate Route Frequency Ordered Stop   01/05/21 2200  clindamycin (CLEOCIN) IVPB 900 mg        900 mg 100 mL/hr over 30 Minutes Intravenous Every 8 hours 01/05/21 1622 01/05/21 2105   01/05/21 0649  clindamycin (CLEOCIN) IVPB 900 mg       See Hyperspace for full Linked Orders Report.   900 mg 100 mL/hr over 30 Minutes Intravenous 60 min pre-op 01/05/21 0649 01/05/21 1207   01/05/21 0649  gentamicin (GARAMYCIN) 570 mg in dextrose 5 % 100 mL IVPB       See Hyperspace for full Linked Orders Report.   5 mg/kg  114.8 kg 114.3 mL/hr over 60 Minutes Intravenous 60 min pre-op 01/05/21 9458 01/05/21 1247        Assessment/Plan: Ms. Bracken is a 45 yo s/p laparoscopic partial colectomy. Doing fair. Awaiting bowel function.  ERAS for pain IS,OOB CPAP at night Clear diet Entereg H&H stable Mg replaced Scds, lovenox     LOS: 1 day    Virl Cagey 01/06/2021

## 2021-01-06 NOTE — Progress Notes (Signed)
Pt ambulated in hall today. Walked approximately 37ft in hall with standby assistance. Pt tolerated well and had minimal c/o pain.

## 2021-01-06 NOTE — Plan of Care (Signed)

## 2021-01-06 NOTE — Progress Notes (Addendum)
Patient has been up and sitting in the chair.  Reinforced knowledge and encouraged use of IS.  Foley catheter discontinued today, patient has been voiding freely, no complaints offered.  Pain remains minimal between 3-5/10, denies nausea.  BM x 2.  Patient has tolerated activities today, no new complaints.

## 2021-01-06 NOTE — Progress Notes (Signed)
Patient placed on full face CPAP with 2L oxygen bleed-in.  Patient tolerating well at this time.  RT will continue to monitor.

## 2021-01-07 LAB — BASIC METABOLIC PANEL
Anion gap: 8 (ref 5–15)
BUN: 6 mg/dL (ref 6–20)
CO2: 23 mmol/L (ref 22–32)
Calcium: 8.5 mg/dL — ABNORMAL LOW (ref 8.9–10.3)
Chloride: 109 mmol/L (ref 98–111)
Creatinine, Ser: 0.82 mg/dL (ref 0.44–1.00)
GFR, Estimated: >60 mL/min (ref >60)
Glucose, Bld: 96 mg/dL (ref 70–99)
Potassium: 3.2 mmol/L — ABNORMAL LOW (ref 3.5–5.1)
Sodium: 140 mmol/L (ref 135–145)

## 2021-01-07 LAB — CBC WITH DIFFERENTIAL/PLATELET
Abs Immature Granulocytes: 0.05 10*3/uL (ref 0.00–0.07)
Basophils Absolute: 0 10*3/uL (ref 0.0–0.1)
Basophils Relative: 0 %
Eosinophils Absolute: 0.1 10*3/uL (ref 0.0–0.5)
Eosinophils Relative: 1 %
HCT: 37.4 % (ref 36.0–46.0)
Hemoglobin: 12 g/dL (ref 12.0–15.0)
Immature Granulocytes: 1 %
Lymphocytes Relative: 26 %
Lymphs Abs: 2.9 10*3/uL (ref 0.7–4.0)
MCH: 31.2 pg (ref 26.0–34.0)
MCHC: 32.1 g/dL (ref 30.0–36.0)
MCV: 97.1 fL (ref 80.0–100.0)
Monocytes Absolute: 1 10*3/uL (ref 0.1–1.0)
Monocytes Relative: 9 %
Neutro Abs: 6.9 10*3/uL (ref 1.7–7.7)
Neutrophils Relative %: 63 %
Platelets: 358 10*3/uL (ref 150–400)
RBC: 3.85 MIL/uL — ABNORMAL LOW (ref 3.87–5.11)
RDW: 13.1 % (ref 11.5–15.5)
WBC: 11.1 10*3/uL — ABNORMAL HIGH (ref 4.0–10.5)
nRBC: 0 % (ref 0.0–0.2)

## 2021-01-07 LAB — MAGNESIUM: Magnesium: 2.3 mg/dL (ref 1.7–2.4)

## 2021-01-07 LAB — PHOSPHORUS: Phosphorus: 2.7 mg/dL (ref 2.5–4.6)

## 2021-01-07 LAB — SURGICAL PATHOLOGY

## 2021-01-07 MED ORDER — POTASSIUM CHLORIDE CRYS ER 20 MEQ PO TBCR
40.0000 meq | EXTENDED_RELEASE_TABLET | Freq: Two times a day (BID) | ORAL | Status: AC
  Administered 2021-01-07 (×2): 40 meq via ORAL
  Filled 2021-01-07 (×2): qty 2

## 2021-01-07 MED ORDER — POTASSIUM CHLORIDE 20 MEQ PO PACK
40.0000 meq | PACK | Freq: Two times a day (BID) | ORAL | Status: DC
  Filled 2021-01-07 (×2): qty 2

## 2021-01-07 NOTE — Progress Notes (Signed)
[facility] Progress Note  2 Days Post-Op  Subjective: Bms no nausea. Wanting more to eat. Soreness continues. Ambulating.   Objective: Vital signs in last 24 hours: Temp:  [98 F (36.7 C)-99.6 F (37.6 C)] 98 F (36.7 C) (09/21 0417) Pulse Rate:  [81-94] 82 (09/21 0740) Resp:  [18-20] 20 (09/21 0417) BP: (123-129)/(62-76) 123/71 (09/21 0740) SpO2:  [96 %-100 %] 96 % (09/21 0740) FiO2 (%):  [21 %] 21 % (09/20 1222) Weight:  [628.6 kg] 119.2 kg (09/21 0500) Last BM Date: 01/06/21  Intake/Output from previous day: 09/20 0701 - 09/21 0700 In: 1237.2 [P.O.:920; I.V.:317.2] Out: 1300 [Urine:1300] Intake/Output this shift: Total I/O In: 480 [P.O.:480] Out: -   General appearance: alert, cooperative, and no distress Resp: normal work of breathing GI: soft, mildly distended, appropriately tender, port sites removed, midline honeycomb in place and staples c./d/I   Lab Results:  Recent Labs    01/06/21 0459 01/07/21 0459  WBC 14.9* 11.1*  HGB 12.8 12.0  HCT 39.1 37.4  PLT 402* 358   BMET Recent Labs    01/06/21 0459 01/07/21 0459  NA 138 140  K 4.0 3.2*  CL 108 109  CO2 19* 23  GLUCOSE 147* 96  BUN 5* 6  CREATININE 0.86 0.82  CALCIUM 8.8* 8.5*   PT/INR No results for input(s): LABPROT, INR in the last 72 hours.  Studies/Results: No results found.  Anti-infectives: Anti-infectives (From admission, onward)    Start     Dose/Rate Route Frequency Ordered Stop   01/05/21 2200  clindamycin (CLEOCIN) IVPB 900 mg        900 mg 100 mL/hr over 30 Minutes Intravenous Every 8 hours 01/05/21 1622 01/05/21 2105   01/05/21 0649  clindamycin (CLEOCIN) IVPB 900 mg       See Hyperspace for full Linked Orders Report.   900 mg 100 mL/hr over 30 Minutes Intravenous 60 min pre-op 01/05/21 0649 01/05/21 1207   01/05/21 0649  gentamicin (GARAMYCIN) 570 mg in dextrose 5 % 100 mL IVPB       See Hyperspace for full Linked Orders Report.   5 mg/kg  114.8  kg 114.3 mL/hr over 60 Minutes Intravenous 60 min pre-op 01/05/21 3817 01/05/21 1247       Assessment/Plan: Tiffany Snow is a 45 yo s/p laparoscopic partial colectomy for diverticulitis. Pathology with diverticulitis. ERAS for pain IS,OOB CPAP at night Soft diet  Entereg d/c  H&H stable K replaced Scds, lovenox  Continue to ambulate    LOS: 2 days    Virl Cagey 01/07/2021

## 2021-01-08 LAB — CBC WITH DIFFERENTIAL/PLATELET
Abs Immature Granulocytes: 0.07 10*3/uL (ref 0.00–0.07)
Basophils Absolute: 0 10*3/uL (ref 0.0–0.1)
Basophils Relative: 0 %
Eosinophils Absolute: 0.5 10*3/uL (ref 0.0–0.5)
Eosinophils Relative: 5 %
HCT: 38.1 % (ref 36.0–46.0)
Hemoglobin: 12 g/dL (ref 12.0–15.0)
Immature Granulocytes: 1 %
Lymphocytes Relative: 27 %
Lymphs Abs: 2.6 10*3/uL (ref 0.7–4.0)
MCH: 31.2 pg (ref 26.0–34.0)
MCHC: 31.5 g/dL (ref 30.0–36.0)
MCV: 99 fL (ref 80.0–100.0)
Monocytes Absolute: 0.8 10*3/uL (ref 0.1–1.0)
Monocytes Relative: 9 %
Neutro Abs: 5.6 10*3/uL (ref 1.7–7.7)
Neutrophils Relative %: 58 %
Platelets: 353 10*3/uL (ref 150–400)
RBC: 3.85 MIL/uL — ABNORMAL LOW (ref 3.87–5.11)
RDW: 13.3 % (ref 11.5–15.5)
WBC: 9.7 10*3/uL (ref 4.0–10.5)
nRBC: 0 % (ref 0.0–0.2)

## 2021-01-08 LAB — BASIC METABOLIC PANEL
Anion gap: 8 (ref 5–15)
BUN: 11 mg/dL (ref 6–20)
CO2: 23 mmol/L (ref 22–32)
Calcium: 8.7 mg/dL — ABNORMAL LOW (ref 8.9–10.3)
Chloride: 108 mmol/L (ref 98–111)
Creatinine, Ser: 0.67 mg/dL (ref 0.44–1.00)
GFR, Estimated: >60 mL/min (ref >60)
Glucose, Bld: 104 mg/dL — ABNORMAL HIGH (ref 70–99)
Potassium: 4.1 mmol/L (ref 3.5–5.1)
Sodium: 139 mmol/L (ref 135–145)

## 2021-01-08 MED ORDER — IBUPROFEN 800 MG PO TABS
800.0000 mg | ORAL_TABLET | Freq: Four times a day (QID) | ORAL | Status: DC
  Administered 2021-01-08 – 2021-01-09 (×3): 800 mg via ORAL
  Filled 2021-01-08 (×3): qty 1

## 2021-01-08 NOTE — Progress Notes (Signed)
[facility] Progress Note  3 Days Post-Op  Subjective: Doing well ambulating. Having Bms. Tolerating diet. Still sore and worried about coming off IV toradol.   Objective: Vital signs in last 24 hours: Temp:  [98 F (36.7 C)-98.5 F (36.9 C)] 98.2 F (36.8 C) (09/22 5830) Pulse Rate:  [73-86] 73 (09/22 0613) Resp:  [18-20] 19 (09/22 0613) BP: (120-126)/(71-81) 120/81 (09/22 0613) SpO2:  [97 %-99 %] 97 % (09/22 0613) Weight:  [118.4 kg-118.5 kg] 118.4 kg (09/22 0643) Last BM Date: 01/08/21  Intake/Output from previous day: 09/21 0701 - 09/22 0700 In: 960 [P.O.:960] Out: -  Intake/Output this shift: No intake/output data recorded.  General appearance: alert, cooperative, and no distress Resp: normal work of breathing GI: soft, appropriately tender, staples c/d/I   Lab Results:  Recent Labs    01/07/21 0459 01/08/21 0511  WBC 11.1* 9.7  HGB 12.0 12.0  HCT 37.4 38.1  PLT 358 353   BMET Recent Labs    01/07/21 0459 01/08/21 0511  NA 140 139  K 3.2* 4.1  CL 109 108  CO2 23 23  GLUCOSE 96 104*  BUN 6 11  CREATININE 0.82 0.67  CALCIUM 8.5* 8.7*   PT/INR No results for input(s): LABPROT, INR in the last 72 hours.  Studies/Results: No results found.  Anti-infectives: Anti-infectives (From admission, onward)    Start     Dose/Rate Route Frequency Ordered Stop   01/05/21 2200  clindamycin (CLEOCIN) IVPB 900 mg        900 mg 100 mL/hr over 30 Minutes Intravenous Every 8 hours 01/05/21 1622 01/05/21 2105   01/05/21 0649  clindamycin (CLEOCIN) IVPB 900 mg       See Hyperspace for full Linked Orders Report.   900 mg 100 mL/hr over 30 Minutes Intravenous 60 min pre-op 01/05/21 0649 01/05/21 1207   01/05/21 0649  gentamicin (GARAMYCIN) 570 mg in dextrose 5 % 100 mL IVPB       See Hyperspace for full Linked Orders Report.   5 mg/kg  114.8 kg 114.3 mL/hr over 60 Minutes Intravenous 60 min pre-op 01/05/21 9407 01/05/21 1247        Assessment/Plan: Tiffany Snow is a 45 yo s/p laparoscopic partial colectomy for diverticulitis.  ERAS for pain Stop IV toradol, changed to ibuprofen po  IS,OOB CPAP at night Soft diet  Can shower Scds, lovenox  Continue to ambulate  Likely dc home tomorrow    LOS: 3 days    Virl Cagey 01/08/2021

## 2021-01-09 MED ORDER — OXYCODONE HCL 5 MG PO TABS: 5.0000 mg | ORAL_TABLET | ORAL | 0 refills | Status: AC | PRN

## 2021-01-09 MED ORDER — ONDANSETRON HCL 4 MG PO TABS: 4.0000 mg | ORAL_TABLET | Freq: Four times a day (QID) | ORAL | 0 refills | Status: AC | PRN

## 2021-01-09 NOTE — Discharge Summary (Signed)
Physician Discharge Summary  Patient ID: Tiffany Snow MRN: [id number] DOB/AGE: [birth year] 46 y.o.  Admit date: 01/05/2021 Discharge date: 01/09/2021  Admission Diagnoses: Sigmoid diverticulitis   Discharge Diagnoses:  Principal Problem:   Diverticulitis of colon without hemorrhage Active Problems:   Sigmoid diverticulitis   Discharged Condition: good  Hospital Course: Ms. Hargrove is a 45 yo with multiple episodes of diverticulitis that are more frequent in nature and cause significant pain and morbidity with her having to miss work and go to the ED. She has had more frequent episodes recently and even had to be treated just before her surgery.  She did well with her laparoscopic sigmoid colectomy and post operatively was ambulating, had good pain control, was started on a diet and was having Bms.   Consults:  None   Significant Diagnostic Studies: None   Treatments: Laparoscopic sigmoid colectomy 9/19   Discharge Exam: Blood pressure 132/81, pulse 76, temperature 98.3 F (36.8 C), temperature source Oral, resp. rate 19, height 4\' 11"  (1.499 m), weight 118.3 kg, SpO2 97 %. General appearance: alert, cooperative, and no distress Resp: normal work of breathing GI: soft, non-distended, staples c/d/I with bruising, no erythema or drainage  Disposition: Discharge disposition: 01-Home or Self Care      Discharge Instructions     Call MD for:  difficulty breathing, headache or visual disturbances   Complete by: As directed    Call MD for:  extreme fatigue   Complete by: As directed    Call MD for:  persistant dizziness or light-headedness   Complete by: As directed    Call MD for:  persistant nausea and vomiting   Complete by: As directed    Call MD for:  redness, tenderness, or signs of infection (pain, swelling, redness, odor or green/yellow discharge around incision site)   Complete by: As directed    Call MD for:  severe uncontrolled pain   Complete by: As directed     Call MD for:  temperature >100.4   Complete by: As directed    Increase activity slowly   Complete by: As directed       Allergies as of 01/09/2021       Reactions   Avelox [moxifloxacin] Hives   Other    Some bandaids cause rash   Penicillins Hives        Medication List     STOP taking these medications    HYDROcodone-acetaminophen 5-325 MG tablet Commonly known as: NORCO/VICODIN   ondansetron 4 MG disintegrating tablet Commonly known as: Zofran ODT       TAKE these medications    busPIRone 15 MG tablet Commonly known as: BUSPAR Take 7.5 mg by mouth 2 (two) times daily.   cetirizine 10 MG tablet Commonly known as: ZYRTEC Take 10 mg by mouth in the morning.   citalopram 40 MG tablet Commonly known as: CELEXA Take 40 mg by mouth in the morning.   Linzess 145 MCG Caps capsule Generic drug: linaclotide TAKE 1 CAPSULE BY MOUTH DAILY BEFORE BREAKFAST. What changed: See the new instructions.   medroxyPROGESTERone 150 MG/ML injection Commonly known as: DEPO-PROVERA Inject 150 mg into the muscle every 3 (three) months.   montelukast 10 MG tablet Commonly known as: SINGULAIR Take 10 mg by mouth at bedtime.   multivitamin with minerals Tabs tablet Take 1 tablet by mouth in the morning.   ondansetron 4 MG tablet Commonly known as: ZOFRAN Take 1 tablet (4 mg total) by mouth every 6 (six)  hours as needed for nausea.   oxyCODONE 5 MG immediate release tablet Commonly known as: Roxicodone Take 1 tablet (5 mg total) by mouth every 4 (four) hours as needed for severe pain or breakthrough pain.   SUMAtriptan 100 MG tablet Commonly known as: IMITREX Take 100 mg by mouth 2 (two) times daily as needed for migraine.   topiramate 25 MG tablet Commonly known as: TOPAMAX Take 25 mg by mouth 2 (two) times daily.        Follow-up Information     Virl Cagey, MD Follow up on 01/15/2021.   Specialty: General Surgery Contact information: [address] [place]  [address] [phone]                Future Appointments  Date Time Provider [place]  01/15/2021  2:15 PM Virl Cagey, MD [facility] None    Signed: Virl Cagey 01/09/2021, 9:50 AM

## 2021-01-09 NOTE — Progress Notes (Signed)
Nsg Discharge Note  Admit Date:  01/05/2021 Discharge date: 01/09/2021   Tiffany Snow to be D/C'd Home per MD order.  AVS completed.  Copy for chart, and copy for patient signed, and dated. Patient/caregiver able to verbalize understanding. Reviewed d/c paperwork with patient and mother and father. Answered all questions. Stable patient and belongings were wheeled to main entrance where she was picked up to d/c to home. Discharge Medication: Allergies as of 01/09/2021       Reactions   Avelox [moxifloxacin] Hives   Other    Some bandaids cause rash   Penicillins Hives        Medication List     STOP taking these medications    HYDROcodone-acetaminophen 5-325 MG tablet Commonly known as: NORCO/VICODIN   ondansetron 4 MG disintegrating tablet Commonly known as: Zofran ODT       TAKE these medications    busPIRone 15 MG tablet Commonly known as: BUSPAR Take 7.5 mg by mouth 2 (two) times daily.   cetirizine 10 MG tablet Commonly known as: ZYRTEC Take 10 mg by mouth in the morning.   citalopram 40 MG tablet Commonly known as: CELEXA Take 40 mg by mouth in the morning.   Linzess 145 MCG Caps capsule Generic drug: linaclotide TAKE 1 CAPSULE BY MOUTH DAILY BEFORE BREAKFAST. What changed: See the new instructions.   medroxyPROGESTERone 150 MG/ML injection Commonly known as: DEPO-PROVERA Inject 150 mg into the muscle every 3 (three) months.   montelukast 10 MG tablet Commonly known as: SINGULAIR Take 10 mg by mouth at bedtime.   multivitamin with minerals Tabs tablet Take 1 tablet by mouth in the morning.   ondansetron 4 MG tablet Commonly known as: ZOFRAN Take 1 tablet (4 mg total) by mouth every 6 (six) hours as needed for nausea.   oxyCODONE 5 MG immediate release tablet Commonly known as: Roxicodone Take 1 tablet (5 mg total) by mouth every 4 (four) hours as needed for severe pain or breakthrough pain.   SUMAtriptan 100 MG tablet Commonly known as:  IMITREX Take 100 mg by mouth 2 (two) times daily as needed for migraine.   topiramate 25 MG tablet Commonly known as: TOPAMAX Take 25 mg by mouth 2 (two) times daily.        Discharge Assessment: Vitals:   01/08/21 2101 01/09/21 0531  BP: 130/72 132/81  Pulse: 79 76  Resp:    Temp: 98.3 F (36.8 C) 98.3 F (36.8 C)  SpO2: 99% 97%   Skin clean, dry and intact without evidence of skin break down, no evidence of skin tears noted. IV catheter discontinued intact. Site without signs and symptoms of complications - no redness or edema noted at insertion site, patient denies c/o pain - only slight tenderness at site.  Dressing with slight pressure applied.  D/c Instructions-Education: Discharge instructions given to patient/family with verbalized understanding. D/c education completed with patient/family including follow up instructions, medication list, d/c activities limitations if indicated, with other d/c instructions as indicated by MD - patient able to verbalize understanding, all questions fully answered. Patient instructed to return to ED, call 911, or call MD for any changes in condition.  Patient escorted via [place], and D/C home via private auto.  Santa Lighter, RN 01/09/2021 11:57 AM

## 2021-01-09 NOTE — Discharge Instructions (Signed)
Discharge Instructions:  Common Complaints: Pain at the incision site is common. This will improve with time. Take your pain medications as described below. Some nausea is common and poor appetite. The main goal is to stay hydrated the first few days after surgery.   Diet/ Activity: Diet as tolerated.  Keep a low fiber, easy to digest diet like you had with diverticulitis while you are healing your anastomosis  You may not have a large appetite, but it is important to stay hydrated. Drink 64 ounces of water a day. Your appetite will return with time.  Keep a dry dressing in place over your staples daily or as needed. Some minor pink/ blood tinged drainage is expected. This will stop in a few days after surgery.  Shower per your regular routine daily.  Do not take hot showers. Take warm showers that are less than 10 minutes. Pat the incision dry. Wear an abdominal binder daily with activity. You do not have to wear this while sleeping or sitting.  Rest and listen to your body, but do not remain in bed all day.  Walk everyday for at least 15-20 minutes. Deep cough and move around every 1-2 hours in the first few days after surgery.  Do not lift > 10 lbs, perform excessive bending, pushing, pulling, squatting for 6-8 weeks after surgery.  The activity restrictions and the abdominal binder are to prevent hernia formation at your incision while you are healing.  Do not place lotions or balms on your incision unless instructed to specifically by Dr. Constance Haw.   Pain Expectations and Narcotics: -After surgery you will have pain associated with your incisions and this is normal. The pain is muscular and nerve pain, and will get better with time. -You are encouraged and expected to take non narcotic medications like tylenol and ibuprofen (when able) to treat pain as multiple modalities can aid with pain treatment. -Narcotics are only used when pain is severe or there is breakthrough pain. -You are not  expected to have a pain score of 0 after surgery, as we cannot prevent pain. A pain score of 3-4 that allows you to be functional, move, walk, and tolerate some activity is the goal. The pain will continue to improve over the days after surgery and is dependent on your surgery. -Due to [place] law, we are only able to give a certain amount of pain medication to treat post operative pain, and we only give additional narcotics on a patient by patient basis.  -For most laparoscopic surgery, studies have shown that the majority of patients only need 10-15 narcotic pills, and for open surgeries most patients only need 15-20.   -Having appropriate expectations of pain and knowledge of pain management with non narcotics is important as we do not want anyone to become addicted to narcotic pain medication.  -Using ice packs in the first 48 hours and heating pads after 48 hours, wearing an abdominal binder (when recommended), and using over the counter medications are all ways to help with pain management.   -Simple acts like meditation and mindfulness practices after surgery can also help with pain control and research has proven the benefit of these practices.  Medication: Take tylenol and ibuprofen as needed for pain control, alternating every 4-6 hours.  Example:  Tylenol 1000mg  @ 6am, 12noon, 6pm, 73midnight (Do not exceed 4000mg  of tylenol a day). Ibuprofen 800mg  @ 9am, 3pm, 9pm, 3am (Do not exceed 3600mg  of ibuprofen a day).  Take Roxicodone for breakthrough  pain every 4 hours.  Take Colace for constipation related to narcotic pain medication.  Take your Linzess if needed.  Drink plenty of water to also prevent constipation.   Contact Information: If you have questions or concerns, please call our office, [phone], Monday- Thursday 8AM-5PM and Friday 8AM-12Noon.  If it is after hours or on the weekend, please call [facility], [phone], [phone], and ask to speak to the surgeon on call  for Dr. Constance Haw at [facility].

## 2021-01-09 NOTE — Progress Notes (Signed)
Denies pain and nausea.  Does not want to be awakened for scheduled tylenol and advil.  Will call if needed.  Surgical sites dry and intact.

## 2021-01-15 ENCOUNTER — Encounter: Payer: Self-pay | Admitting: General Surgery

## 2021-01-15 ENCOUNTER — Other Ambulatory Visit: Payer: Self-pay

## 2021-01-15 ENCOUNTER — Ambulatory Visit (INDEPENDENT_AMBULATORY_CARE_PROVIDER_SITE_OTHER): Payer: PRIVATE HEALTH INSURANCE | Admitting: General Surgery

## 2021-01-15 VITALS — BP 156/81 | HR 87 | Temp 98.7°F | Resp 16 | Ht 59.0 in | Wt 247.0 lb

## 2021-01-15 DIAGNOSIS — K5732 Diverticulitis of large intestine without perforation or abscess without bleeding: Secondary | ICD-10-CM

## 2021-01-15 NOTE — Progress Notes (Signed)
[facility]  Doing well and eating. Having multiple Bms most days that are soft but still using Linzess.   BP (!) 156/81   Pulse 87   Temp 98.7 F (37.1 C) (Other (Comment))   Resp 16   Ht 4\' 11"  (1.499 m)   Wt 247 lb (112 kg)   SpO2 96%   BMI 49.89 kg/m  Staple in place, no erythema or drainage, steri strips placed once staples removed  Soft, nondistended, appropriately tender  Patient s/p laparoscopic partial colectomy. Doing well.   No heavy lifting > 10 lbs, excessive bending, pushing, pulling, or squatting for 6-8 weeks after surgery.  Continue to add things back slowly to diet. Stay hydrated. Steri strips will peel up after 5-7 days. Ok to remove then. Ok to shower now.  Trial not using the Linzess and see how your Bms do   Future Appointments  Date Time Provider [place]  01/27/2021 10:45 AM Virl Cagey, MD [facility] None    Curlene Labrum, MD [facility] [address] [place], [place] [address] [phone] (office)'

## 2021-01-15 NOTE — Patient Instructions (Addendum)
No heavy lifting > 10 lbs, excessive bending, pushing, pulling, or squatting for 6-8 weeks after surgery.  Continue to add things back slowly to diet. Stay hydrated. Steri strips will peel up after 5-7 days. Ok to remove then. Ok to shower now.  Trial not using the Linzess and see how your Bms do

## 2021-01-27 ENCOUNTER — Other Ambulatory Visit: Payer: Self-pay

## 2021-01-27 ENCOUNTER — Ambulatory Visit (INDEPENDENT_AMBULATORY_CARE_PROVIDER_SITE_OTHER): Payer: PRIVATE HEALTH INSURANCE | Admitting: General Surgery

## 2021-01-27 ENCOUNTER — Encounter: Payer: Self-pay | Admitting: General Surgery

## 2021-01-27 VITALS — BP 112/71 | HR 78 | Temp 98.2°F | Resp 12 | Ht 59.0 in | Wt 248.0 lb

## 2021-01-27 DIAGNOSIS — K5732 Diverticulitis of large intestine without perforation or abscess without bleeding: Secondary | ICD-10-CM

## 2021-01-27 NOTE — Patient Instructions (Addendum)
Continue to add back food. Keep stools regular and soft. Return to work with lifting restrictions on 01/28/2021, no restrictions after 03/02/2021. No heavy lifting > 10 lbs, excessive bending, pushing, pulling, or squatting for 6-8 weeks after surgery.  Neosporin to the umbilical area daily to get dry blood to soften up and fall off.

## 2021-01-27 NOTE — Progress Notes (Signed)
[facility]  Patient s/p laparoscopic partial colectomy. Doing well. Eating more and having adequate pain control. Wants to return to work with restrictions. Still using Linzess for Bms.   BP 112/71   Pulse 78   Temp 98.2 F (36.8 C) (Other (Comment))   Resp 12   Ht 4\' 11"  (1.499 m)   Wt 248 lb (112.5 kg)   SpO2 95%   BMI 50.09 kg/m  Incision sites healing, minor dried blood around umbilicus, no erythema or drainage   Future Appointments  Date Time Provider [place]  03/03/2021  9:15 AM Virl Cagey, MD [facility] None    Continue to add back food. Keep stools regular and soft. Return to work with lifting restrictions on 01/28/2021, no restrictions after 03/02/2021. No heavy lifting > 10 lbs, excessive bending, pushing, pulling, or squatting for 6-8 weeks after surgery.  Neosporin to the umbilical area daily to get dry blood to soften up and fall off.   Curlene Labrum, MD [facility] [address] [place], [place] [address] [phone] (office)

## 2021-03-03 ENCOUNTER — Encounter: Payer: Self-pay | Admitting: General Surgery

## 2021-03-03 ENCOUNTER — Other Ambulatory Visit: Payer: Self-pay

## 2021-03-03 ENCOUNTER — Ambulatory Visit (INDEPENDENT_AMBULATORY_CARE_PROVIDER_SITE_OTHER): Payer: PRIVATE HEALTH INSURANCE | Admitting: General Surgery

## 2021-03-03 VITALS — BP 106/70 | HR 81 | Temp 98.4°F | Resp 12 | Ht 59.0 in | Wt 251.0 lb

## 2021-03-03 DIAGNOSIS — K5732 Diverticulitis of large intestine without perforation or abscess without bleeding: Secondary | ICD-10-CM

## 2021-03-03 NOTE — Progress Notes (Signed)
[facility]  Doing well. Feeling some soreness at times. Having Bms with her linzess.  BP 106/70   Pulse 81   Temp 98.4 F (36.9 C) (Other (Comment))   Resp 12   Ht 4\' 11"  (1.499 m)   Wt 251 lb (113.9 kg)   SpO2 98%   BMI 50.70 kg/m  Healed incision, no obvious hernia  Patient s/p laparoscopic partial colectomy for diverticulitis. Doing well overall.   Diet and activity as tolerated. Continue your linzess per GI.  PRN Follow up   Curlene Labrum, MD [facility] [address] [place], [place] [address] [phone] (office)

## 2021-03-03 NOTE — Patient Instructions (Signed)
Diet and activity as tolerated. Continue your linzess per GI.

## 2022-03-17 IMAGING — CT CT ABD-PELV W/O CM
2 of 4 series · 17 of 46 positions shown, 19 images · non-contrast
Comparison: CT 08/18/2020

CLINICAL DATA: Left-sided pain with nausea

EXAM:
CT ABDOMEN AND PELVIS WITHOUT CONTRAST
TECHNIQUE: Multidetector CT imaging of the abdomen and pelvis was performed
following the standard protocol without IV contrast.

[Series 2: axial st · axial · 0.74mm/px · z∈[-634,-239]mm · 14 of 91 slices shown, 16 images]
[im 6/91  soft-tissue]
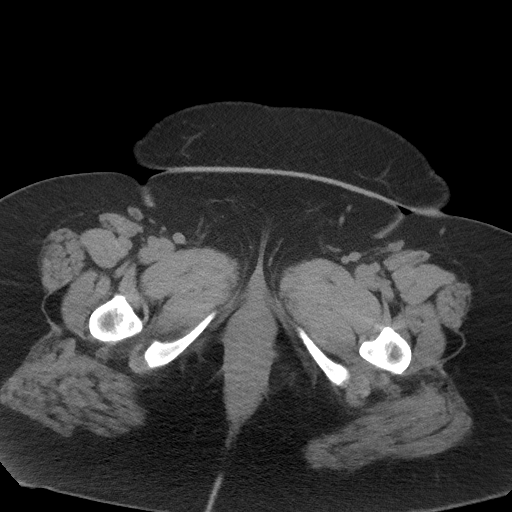
[im 6/91  bone]
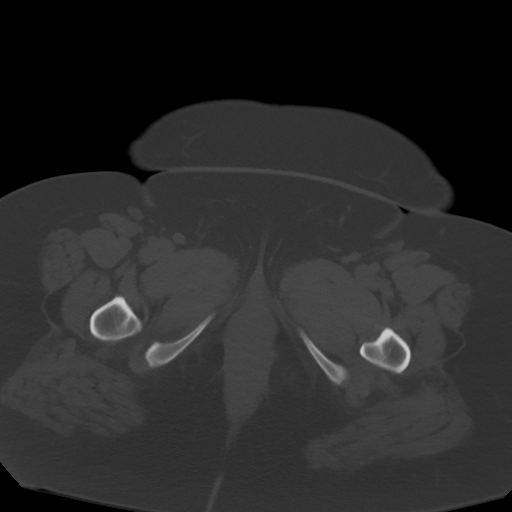
[im 11/91  soft-tissue]
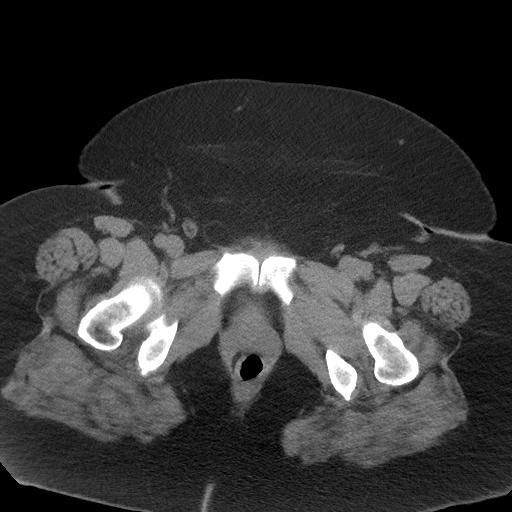
[im 16/91  soft-tissue]
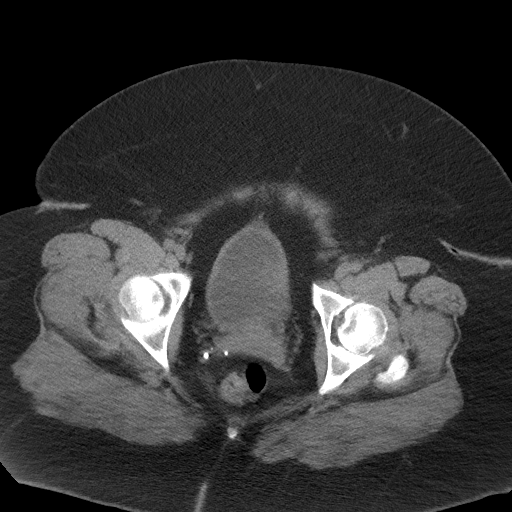
[im 27/91  soft-tissue]
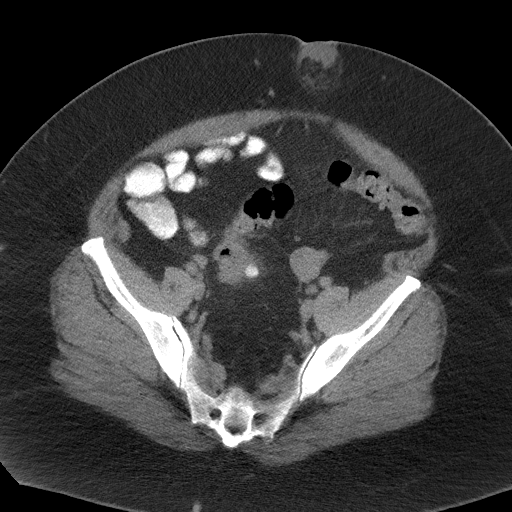
[im 32/91  soft-tissue]
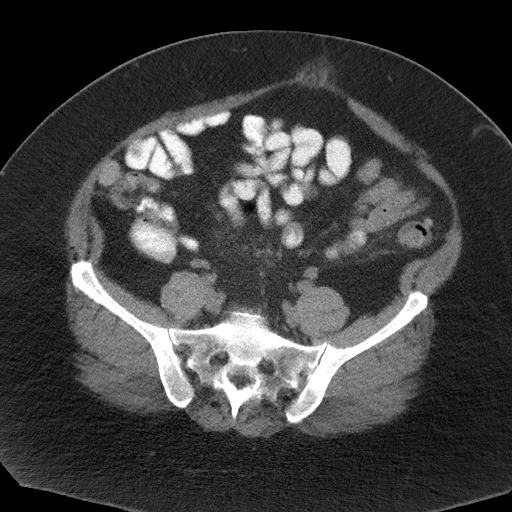
[im 38/91  soft-tissue]
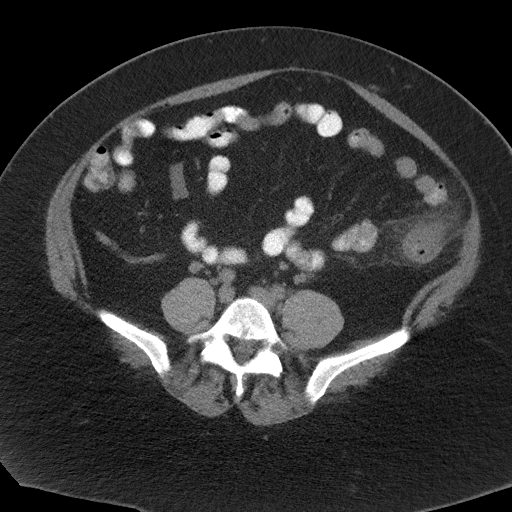
[im 43/91  soft-tissue]
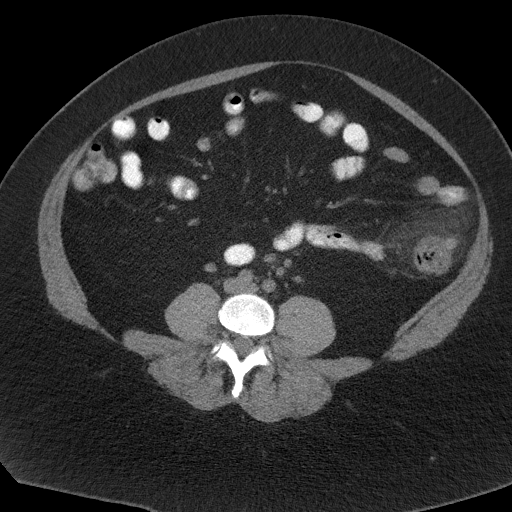
[im 48/91  soft-tissue]
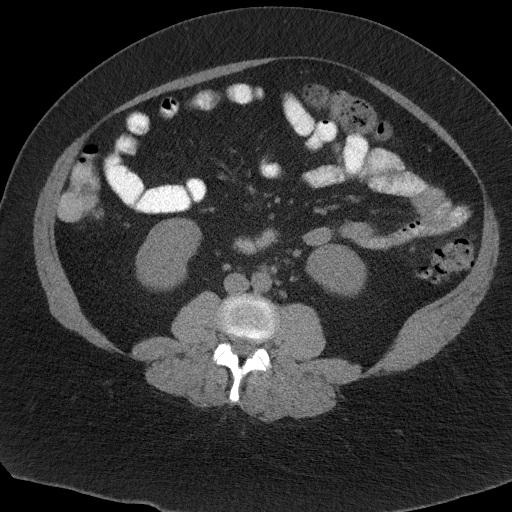
[im 53/91  soft-tissue]
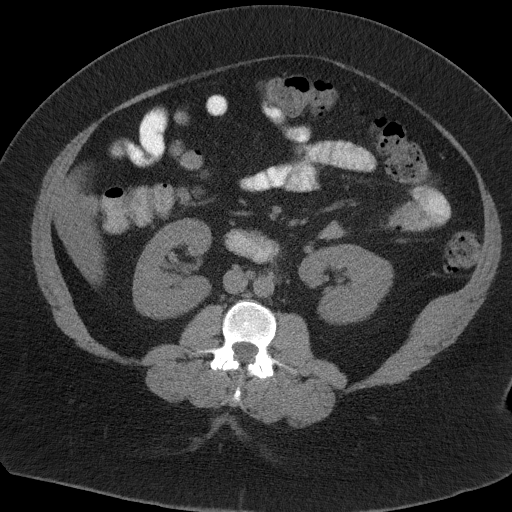
[im 53/91  bone]
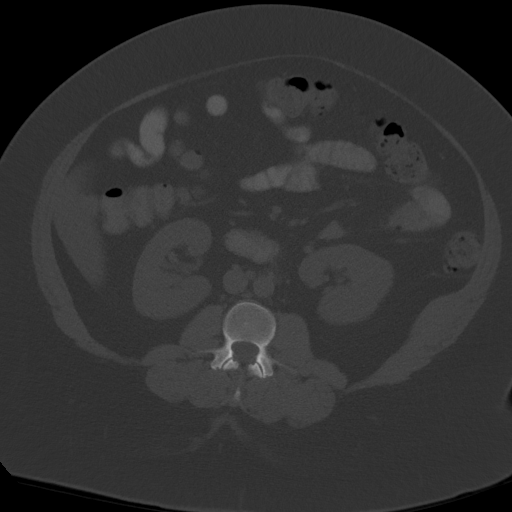
[im 59/91  soft-tissue]
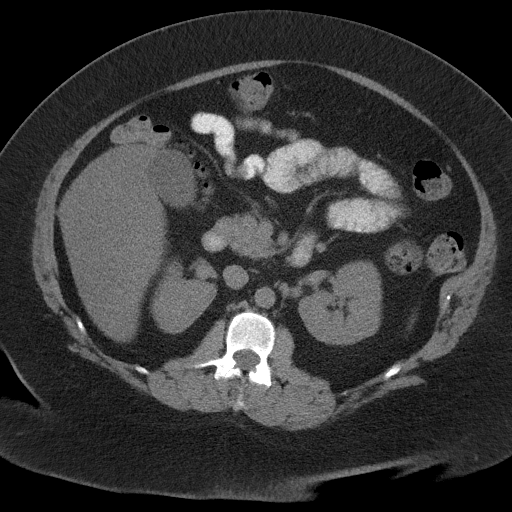
[im 69/91  soft-tissue]
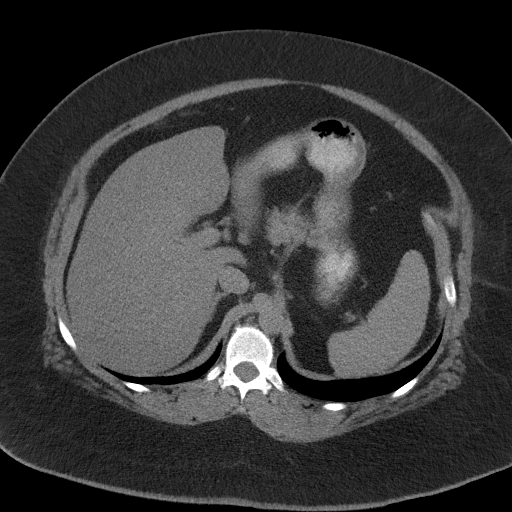
[im 75/91  soft-tissue]
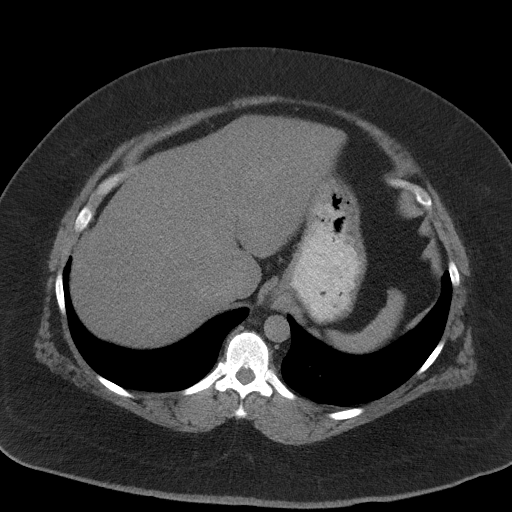
[im 80/91  soft-tissue]
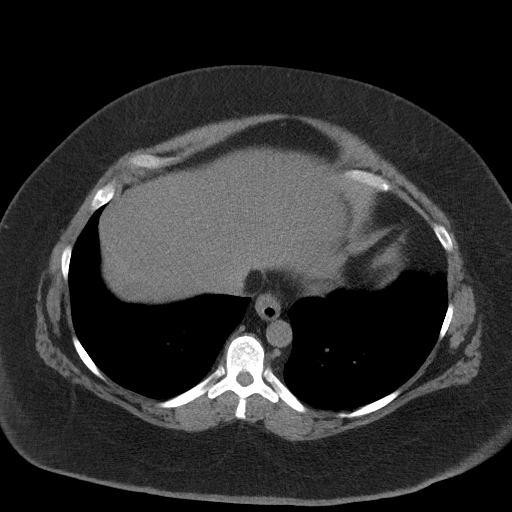
[im 85/91  soft-tissue]
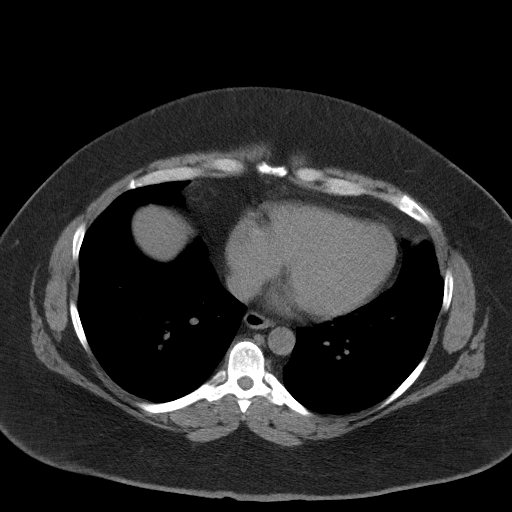

[Series 5: coronal st · coronal · 0.88mm/px · 3 of 107 slices shown]
[im 36/107  soft-tissue]
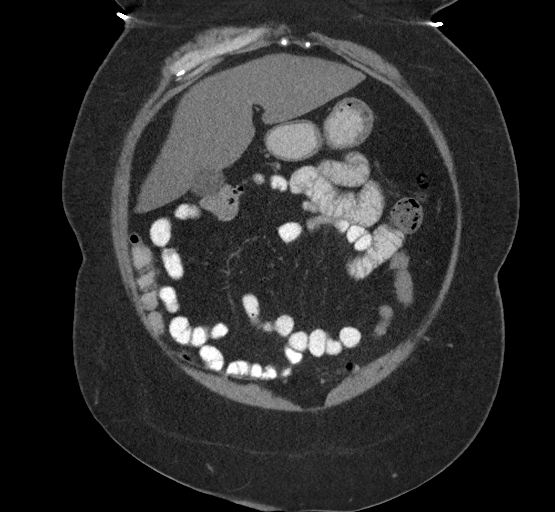
[im 48/107  soft-tissue]
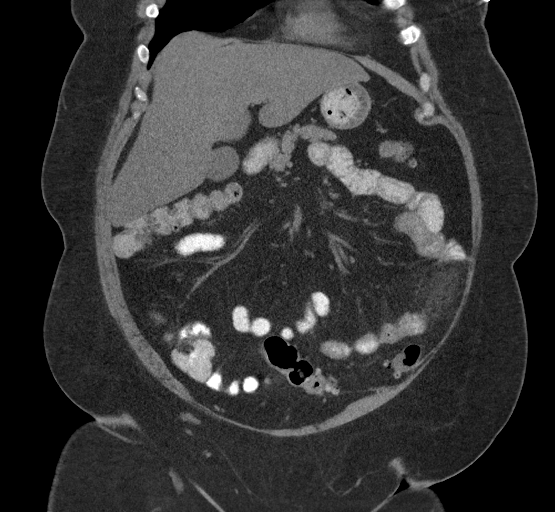
[im 59/107  soft-tissue]
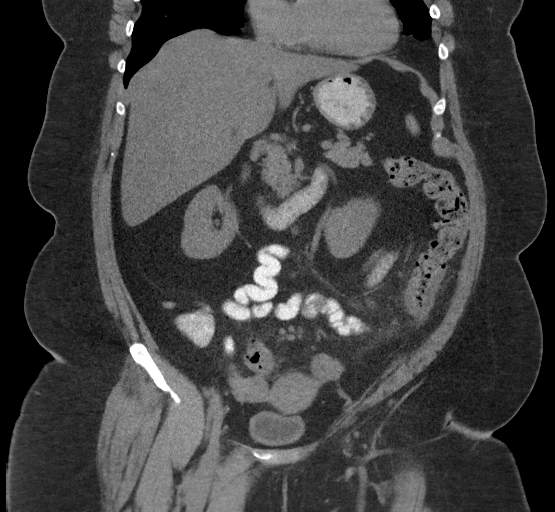

[17 of 46 positions shown; findings below may reference images not displayed]

FINDINGS: Lower chest: Lung bases demonstrate no acute consolidation or
effusion. Normal cardiac size

Hepatobiliary: Hepatic steatosis. No calcified gallstone or biliary
dilatation

Pancreas: Unremarkable. No pancreatic ductal dilatation or
surrounding inflammatory changes.

Spleen: Normal in size without focal abnormality.

Adrenals/Urinary Tract: Adrenal glands are unremarkable. Kidneys are
normal, without renal calculi, focal lesion, or hydronephrosis.
Bladder is unremarkable.

Stomach/Bowel: The stomach is nonenlarged. No dilated small bowel.
Diverticular disease of the colon. Interval wall thickening and
focal inflammation at the descending colon consistent with acute
diverticulitis. Negative appendix.

Vascular/Lymphatic: Nonaneurysmal aorta.  No suspicious nodes

Reproductive: Uterus and bilateral adnexa are unremarkable.

Other: Negative for free air or free fluid.

Musculoskeletal: No acute or significant osseous findings.
IMPRESSION: 1. Findings consistent with acute non perforated diverticulitis
involving the distal descending colon.
2. Hepatic steatosis

## 2022-06-28 IMAGING — CT CT ABD-PELV W/ CM
2 of 5 series · 16 of 46 positions shown, 18 images · IV contrast (Omnipaque or Isovue)
Comparison: September 16, 2020

CLINICAL DATA: Lower abdomen pain for 3 days. Assess for
diverticulitis.

EXAM:
CT ABDOMEN AND PELVIS WITH CONTRAST
TECHNIQUE: Multidetector CT imaging of the abdomen and pelvis was performed
using the standard protocol following bolus administration of
intravenous contrast.
CONTRAST:  75mL OMNIPAQUE IOHEXOL 350 MG/ML SOLN

[Series 2: axial st · axial · 0.84mm/px · z∈[+1055,+1460]mm · 13 of 93 slices shown, 15 images]
[im 6/93  soft-tissue]
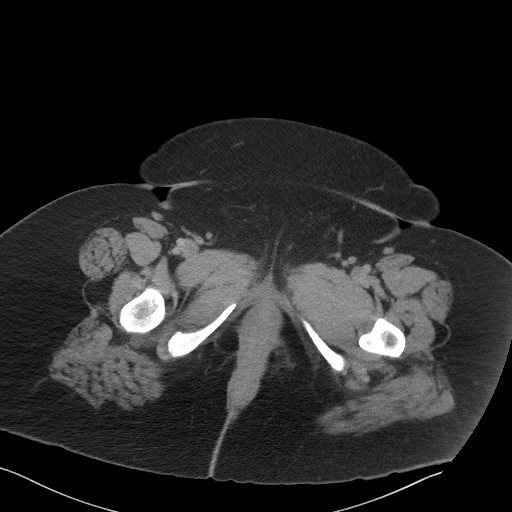
[im 6/93  bone]
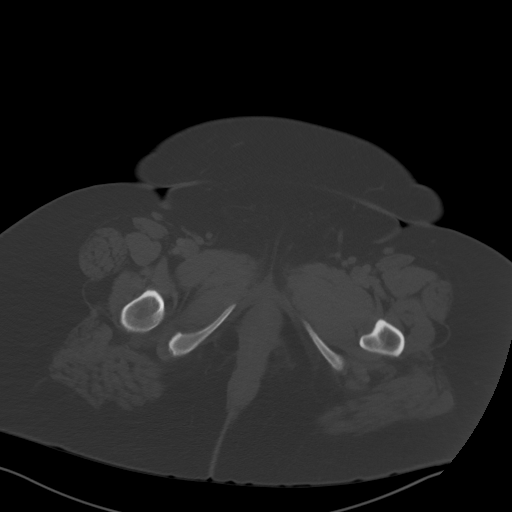
[im 11/93  soft-tissue]
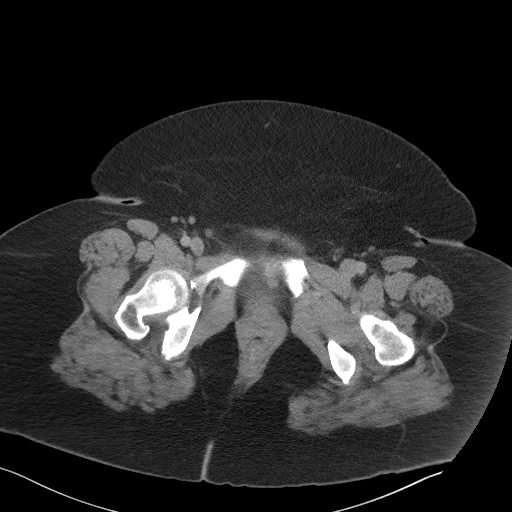
[im 21/93  soft-tissue]
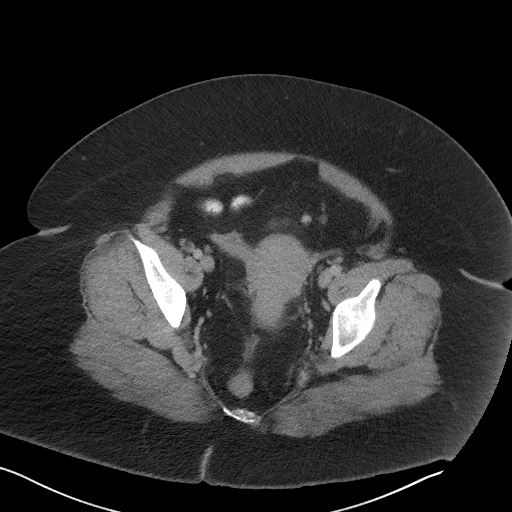
[im 26/93  soft-tissue]
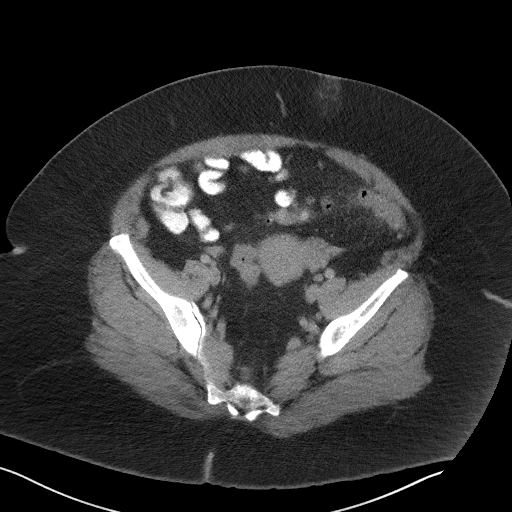
[im 31/93  soft-tissue]
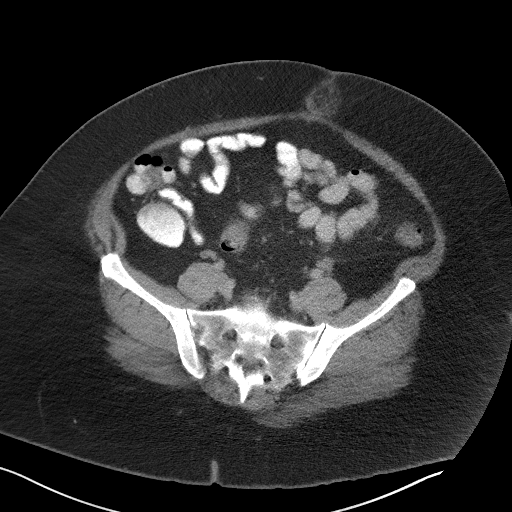
[im 41/93  soft-tissue]
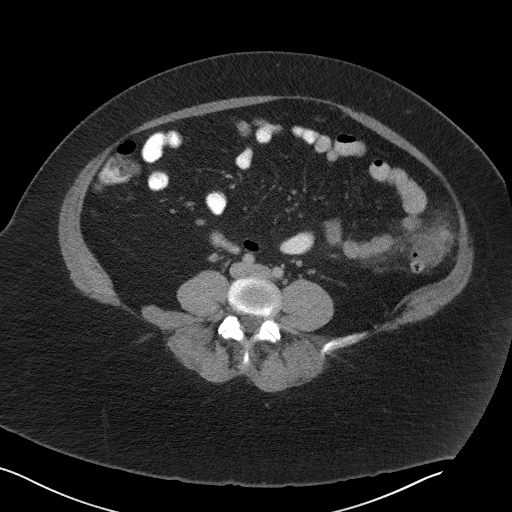
[im 47/93  soft-tissue]
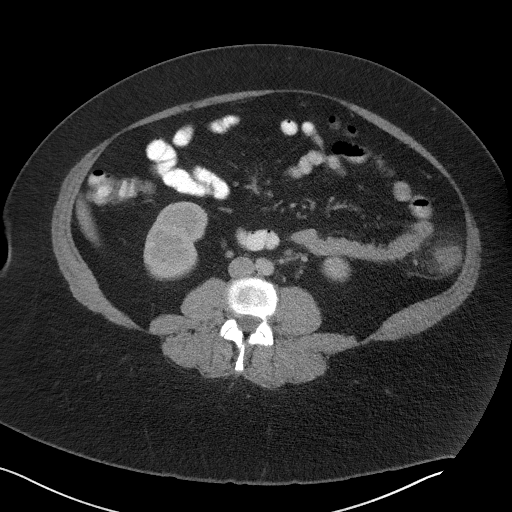
[im 52/93  soft-tissue]
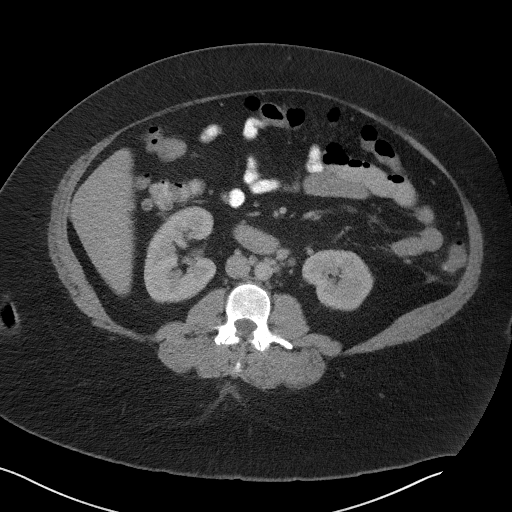
[im 62/93  soft-tissue]
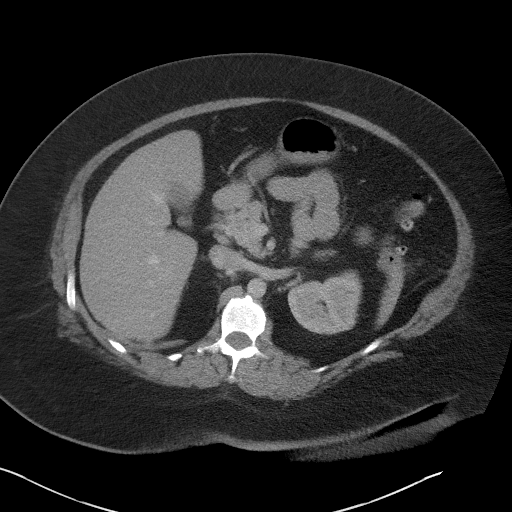
[im 62/93  bone]
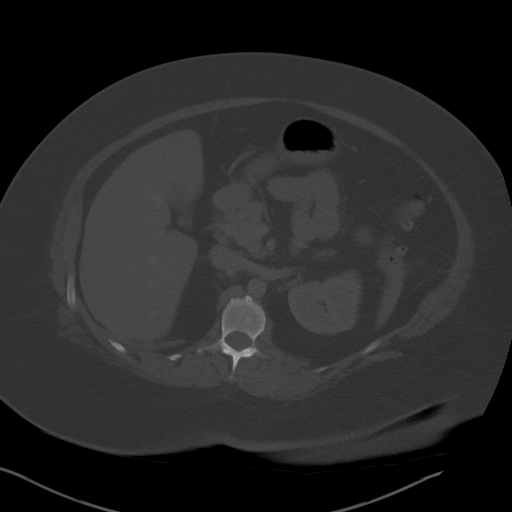
[im 67/93  soft-tissue]
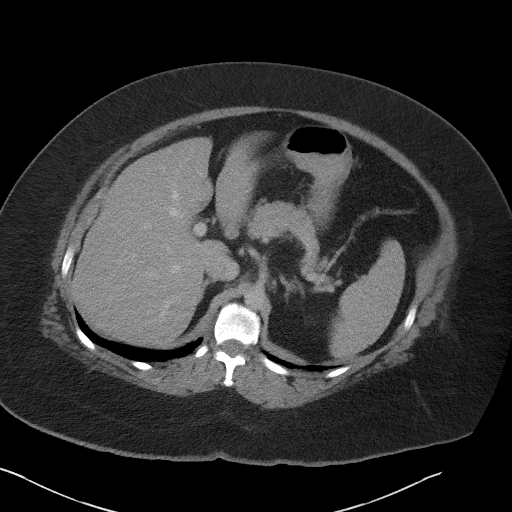
[im 72/93  soft-tissue]
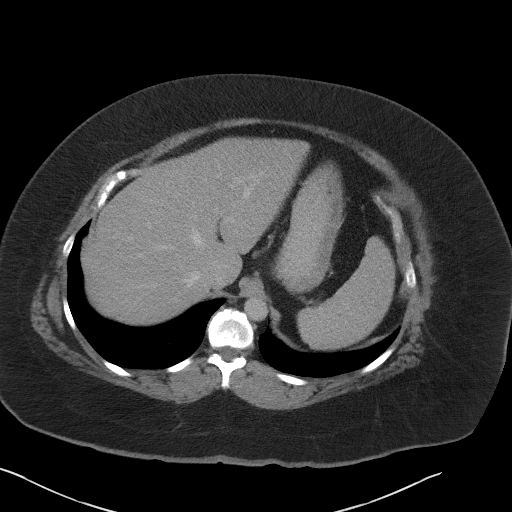
[im 82/93  soft-tissue]
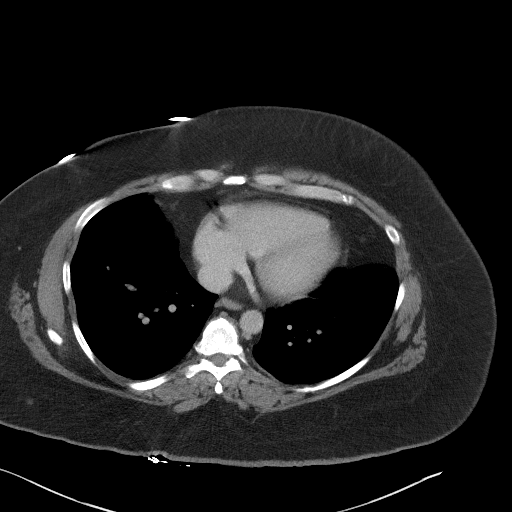
[im 87/93  soft-tissue]
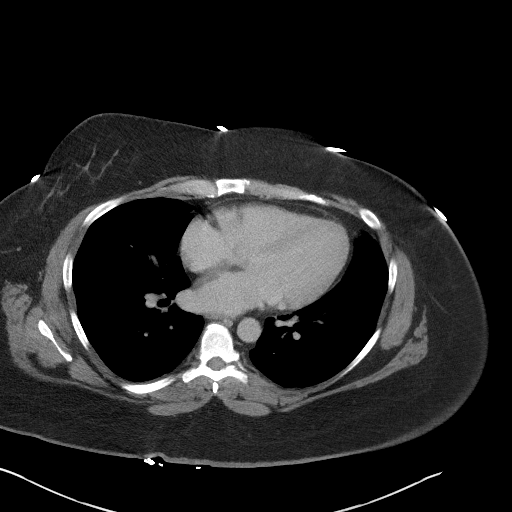

[Series 5: coronal st · coronal · 0.82mm/px · 3 of 117 slices shown]
[im 39/117  soft-tissue]
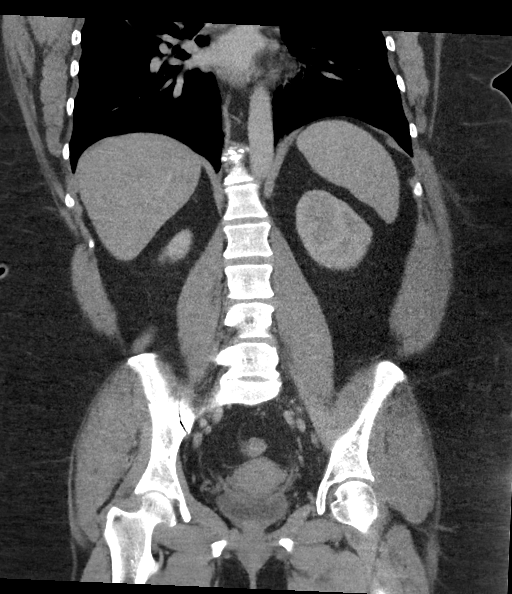
[im 52/117  soft-tissue]
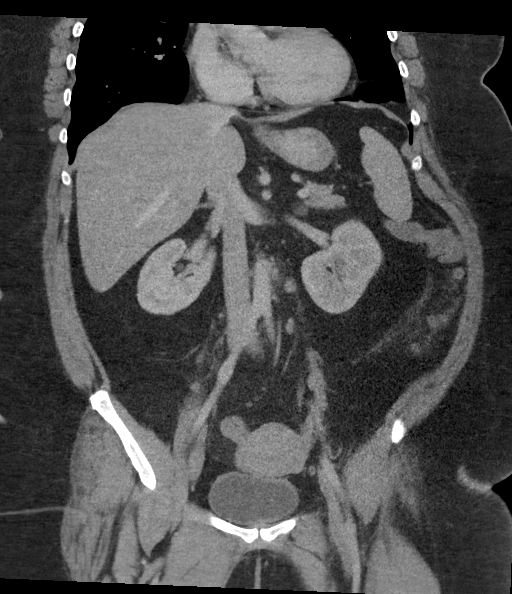
[im 65/117  soft-tissue]
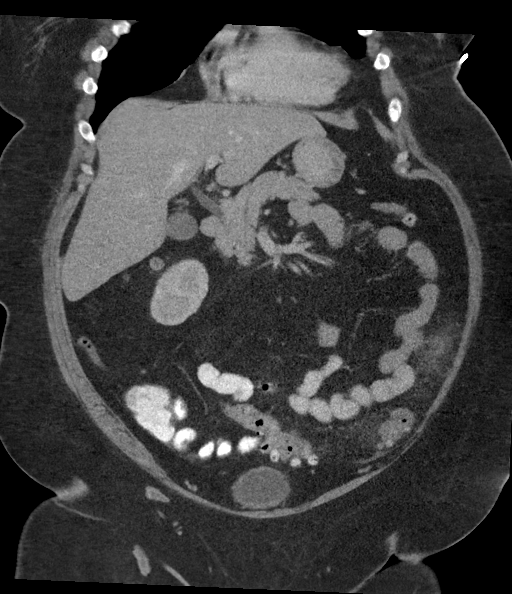

[16 of 46 positions shown; findings below may reference images not displayed]

FINDINGS: Lower chest: No acute abnormality.

Hepatobiliary: Diffuse low density of liver is identified without
focal liver lesion. The gallbladder is normal. The biliary tree is
normal.

Pancreas: Unremarkable. No pancreatic ductal dilatation or
surrounding inflammatory changes.

Spleen: Normal in size without focal abnormality.

Adrenals/Urinary Tract: Adrenal glands are unremarkable. Kidneys are
normal, without renal calculi, focal lesion, or hydronephrosis.
Bladder is unremarkable.

Stomach/Bowel: There is diverticulitis with surrounding inflammation
in the mid to lower descending colon. There is no small bowel
obstruction. The appendix is normal. Minimal hiatal hernia is
identified. The stomach is otherwise normal.

Vascular/Lymphatic: The aorta is normal. Small retroperitoneal lymph
nodes are identified largest measures 8 mm.

Reproductive: Uterus and bilateral adnexa are unremarkable.

Other: Umbilical herniation of mesenteric fat is identified. There
is no free air.

Musculoskeletal: Degenerative joint changes of the spine are noted.
IMPRESSION: 1. Acute diverticulitis of the mid to lower descending colon.
2. Fatty infiltration of liver.

## 2023-11-12 ENCOUNTER — Encounter (HOSPITAL_COMMUNITY): Payer: Self-pay

## 2023-11-12 ENCOUNTER — Other Ambulatory Visit: Payer: Self-pay

## 2023-11-12 ENCOUNTER — Emergency Department (HOSPITAL_COMMUNITY): Payer: PRIVATE HEALTH INSURANCE

## 2023-11-12 ENCOUNTER — Emergency Department (HOSPITAL_COMMUNITY)
Admission: EM | Admit: 2023-11-12 | Discharge: 2023-11-13 | Disposition: A | Discharge status: Home / Self Care | Payer: PRIVATE HEALTH INSURANCE

## 2023-11-12 DIAGNOSIS — G43809 Other migraine, not intractable, without status migrainosus: Secondary | ICD-10-CM | POA: Insufficient documentation

## 2023-11-12 DIAGNOSIS — X501XXA Overexertion from prolonged static or awkward postures, initial encounter: Secondary | ICD-10-CM | POA: Diagnosis not present

## 2023-11-12 DIAGNOSIS — S93402A Sprain of unspecified ligament of left ankle, initial encounter: Secondary | ICD-10-CM | POA: Insufficient documentation

## 2023-11-12 DIAGNOSIS — M25572 Pain in left ankle and joints of left foot: Secondary | ICD-10-CM | POA: Diagnosis present

## 2023-11-12 MED ORDER — METOCLOPRAMIDE HCL 5 MG/ML IJ SOLN
10.0000 mg | Freq: Once | INTRAMUSCULAR | Status: AC
  Administered 2023-11-12: 10 mg via INTRAVENOUS
  Filled 2023-11-12: qty 2

## 2023-11-12 MED ORDER — KETOROLAC TROMETHAMINE 15 MG/ML IJ SOLN
15.0000 mg | Freq: Once | INTRAMUSCULAR | Status: AC
  Administered 2023-11-12: 15 mg via INTRAVENOUS
  Filled 2023-11-12: qty 1

## 2023-11-12 MED ORDER — DIPHENHYDRAMINE HCL 50 MG/ML IJ SOLN
25.0000 mg | Freq: Once | INTRAMUSCULAR | Status: AC
  Administered 2023-11-12: 25 mg via INTRAVENOUS
  Filled 2023-11-12: qty 1

## 2023-11-12 MED ORDER — SODIUM CHLORIDE 0.9 % IV BOLUS
500.0000 mL | Freq: Once | INTRAVENOUS | Status: AC
  Administered 2023-11-12: 500 mL via INTRAVENOUS

## 2023-11-12 NOTE — ED Triage Notes (Signed)
 Pt stated that she has had a migraine for the last 4 days and has seen her PCP for it with no relief. Pt received benadryl  and steroid shot as well. Pt also stated that she twisted her left ankle earlier today and can't put weight on it

## 2023-11-12 NOTE — ED Provider Notes (Signed)
 [name] Provider Note   CSN: [id number] Arrival date & time: 11/12/23  2125     Patient presents with: Migraine   Tiffany Snow is a 48 y.o. female.  {Add pertinent medical, surgical, social history, OB history to HPI:2340} 48 year old female presents for evaluation of migraine headache.  States it is been 4 days in her home medications are working.  She states she had a she they were tried earlier today that did not help.  Also states she rolled her left ankle today and is having ankle pain and difficulty putting weight on her ankle.  Denies any other symptoms or concerns at this time.   Migraine Associated symptoms include headaches. Pertinent negatives include no chest pain, no abdominal pain and no shortness of breath.       Prior to Admission medications   Medication Sig Start Date End Date Taking? Authorizing Provider  busPIRone  (BUSPAR ) 15 MG tablet Take 7.5 mg by mouth 2 (two) times daily.    [provider]  cetirizine (ZYRTEC) 10 MG tablet Take 10 mg by mouth in the morning.    [provider]  citalopram  (CELEXA ) 40 MG tablet Take 40 mg by mouth in the morning.    [provider]  LINZESS  145 MCG CAPS capsule TAKE 1 CAPSULE BY MOUTH DAILY BEFORE BREAKFAST. Patient taking differently: Take 145 mcg by mouth daily before breakfast. 12/24/20   Shirlean Therisa ORN, NP  medroxyPROGESTERone (DEPO-PROVERA) 150 MG/ML injection Inject 150 mg into the muscle every 3 (three) months. 09/06/20   [provider]  montelukast  (SINGULAIR ) 10 MG tablet Take 10 mg by mouth at bedtime.    [provider]  Multiple Vitamin (MULTIVITAMIN WITH MINERALS) TABS tablet Take 1 tablet by mouth in the morning.    [provider]  ondansetron  (ZOFRAN ) 4 MG tablet Take 1 tablet (4 mg total) by mouth every 6 (six) hours as needed for nausea. 01/09/21   Kallie Manuelita BROCKS, MD  SUMAtriptan  (IMITREX ) 100 MG  tablet Take 100 mg by mouth 2 (two) times daily as needed for migraine. 09/24/20   [provider]  topiramate  (TOPAMAX ) 25 MG tablet Take 25 mg by mouth 2 (two) times daily.    [provider]    Allergies: Avelox [moxifloxacin], Other, and Penicillins    Review of Systems  Constitutional:  Negative for chills and fever.  HENT:  Negative for ear pain and sore throat.   Eyes:  Negative for pain and visual disturbance.  Respiratory:  Negative for cough and shortness of breath.   Cardiovascular:  Negative for chest pain and palpitations.  Gastrointestinal:  Negative for abdominal pain and vomiting.  Genitourinary:  Negative for dysuria and hematuria.  Musculoskeletal:  Negative for arthralgias and back pain.  Skin:  Negative for color change and rash.  Neurological:  Positive for headaches. Negative for seizures and syncope.  All other systems reviewed and are negative.   Updated Vital Signs BP 131/74   Pulse 87   Temp 98.5 F (36.9 C)   Resp 18   Ht 4' 11 (1.499 m)   Wt 127 kg   LMP  (LMP Unknown)   BMI 56.55 kg/m   Physical Exam Vitals and nursing note reviewed.  Constitutional:      General: She is not in acute distress.    Appearance: Normal appearance. She is well-developed. She is not ill-appearing.  HENT:     Head: Normocephalic and atraumatic.  Eyes:     Conjunctiva/sclera: Conjunctivae normal.  Cardiovascular:     Rate and Rhythm: Normal rate and regular rhythm.     Heart sounds: No murmur heard. Pulmonary:     Effort: Pulmonary effort is normal. No respiratory distress.     Breath sounds: Normal breath sounds.  Abdominal:     Palpations: Abdomen is soft.     Tenderness: There is no abdominal tenderness.  Musculoskeletal:        General: Swelling present.     Cervical back: Neck supple.     Comments: Left ankle mildly swollen with tenderness to palpation over the lateral aspect, full ROM Of ankle   Skin:    General: Skin is warm and dry.      Capillary Refill: Capillary refill takes less than 2 seconds.  Neurological:     General: No focal deficit present.     Mental Status: She is alert.  Psychiatric:        Mood and Affect: Mood normal.     (all labs ordered are listed, but only abnormal results are displayed) Labs Reviewed - No data to display  EKG: None  Radiology: No results found.  {Document cardiac monitor, telemetry assessment procedure when appropriate:32947} Procedures   Medications Ordered in the ED  ketorolac  (TORADOL ) 15 MG/ML injection 15 mg (has no administration in time range)  sodium chloride  0.9 % bolus 500 mL (has no administration in time range)  metoCLOPramide  (REGLAN ) injection 10 mg (has no administration in time range)  diphenhydrAMINE  (BENADRYL ) injection 25 mg (has no administration in time range)      {Click here for ABCD2, HEART and other calculators REFRESH Note before signing:1}                              Medical Decision Making Amount and/or Complexity of Data Reviewed Radiology: ordered.  Risk Prescription drug management.   ***  {Document critical care time when appropriate  Document review of labs and clinical decision tools ie CHADS2VASC2, etc  Document your independent review of radiology images and any outside records  Document your discussion with family members, caretakers and with consultants  Document social determinants of health affecting pt's care  Document your decision making why or why not admission, treatments were needed:32947:::1}   Final diagnoses:  None    ED Discharge Orders     None

## 2023-11-13 NOTE — Discharge Instructions (Signed)
 You can use Tylenol  and Motrin  as needed for pain.  Follow-up with your primary care doctor as needed.
